# Patient Record
Sex: Male | Born: 1945 | Race: White | Hispanic: No | Marital: Married | Smoking: Former smoker
Health system: Southern US, Community
[De-identification: ages and names within clinical notes are randomized; demographics above are authoritative.]

## PROBLEM LIST (undated history)

## (undated) DIAGNOSIS — C801 Malignant (primary) neoplasm, unspecified: Secondary | ICD-10-CM

## (undated) DIAGNOSIS — F32A Depression, unspecified: Secondary | ICD-10-CM

## (undated) DIAGNOSIS — F329 Major depressive disorder, single episode, unspecified: Secondary | ICD-10-CM

## (undated) DIAGNOSIS — K219 Gastro-esophageal reflux disease without esophagitis: Secondary | ICD-10-CM

## (undated) DIAGNOSIS — E785 Hyperlipidemia, unspecified: Secondary | ICD-10-CM

## (undated) DIAGNOSIS — I714 Abdominal aortic aneurysm, without rupture, unspecified: Secondary | ICD-10-CM

## (undated) DIAGNOSIS — F419 Anxiety disorder, unspecified: Secondary | ICD-10-CM

## (undated) HISTORY — PX: SPINE SURGERY: SHX786

## (undated) HISTORY — DX: Abdominal aortic aneurysm, without rupture, unspecified: I71.40

## (undated) HISTORY — DX: Depression, unspecified: F32.A

## (undated) HISTORY — DX: Abdominal aortic aneurysm, without rupture: I71.4

## (undated) HISTORY — DX: Anxiety disorder, unspecified: F41.9

## (undated) HISTORY — PX: HERNIA REPAIR: SHX51

## (undated) HISTORY — PX: COLONOSCOPY: SHX174

## (undated) HISTORY — DX: Gastro-esophageal reflux disease without esophagitis: K21.9

## (undated) HISTORY — DX: Hyperlipidemia, unspecified: E78.5

---
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
## 1982-10-27 HISTORY — PX: HERNIA REPAIR: SHX51

## 1985-10-27 HISTORY — PX: SPINE SURGERY: SHX786

## 2013-12-08 ENCOUNTER — Encounter: Payer: Self-pay | Admitting: *Deleted

## 2013-12-26 ENCOUNTER — Ambulatory Visit: Payer: Self-pay | Admitting: General Practice

## 2013-12-28 ENCOUNTER — Encounter: Payer: Self-pay | Admitting: General Practice

## 2013-12-28 ENCOUNTER — Ambulatory Visit (INDEPENDENT_AMBULATORY_CARE_PROVIDER_SITE_OTHER): Payer: Medicare HMO | Admitting: General Practice

## 2013-12-28 ENCOUNTER — Encounter (INDEPENDENT_AMBULATORY_CARE_PROVIDER_SITE_OTHER): Payer: Self-pay

## 2013-12-28 VITALS — BP 129/82 | HR 73 | Temp 97.0°F | Ht 67.75 in | Wt 178.0 lb

## 2013-12-28 DIAGNOSIS — R21 Rash and other nonspecific skin eruption: Secondary | ICD-10-CM

## 2013-12-28 DIAGNOSIS — F411 Generalized anxiety disorder: Secondary | ICD-10-CM

## 2013-12-28 MED ORDER — CITALOPRAM HYDROBROMIDE 20 MG PO TABS: 20.0000 mg | ORAL_TABLET | Freq: Every day | ORAL | Status: DC

## 2013-12-28 NOTE — Progress Notes (Signed)
   Subjective:    Patient ID: Michael Horton, male    DOB: 07-27-1946, 68 y.o.   MRN: [id number]  HPI Patient presents today to establish care. He moved from [place], 1 year ago to [place], [place]. History of anxiety and taking citalopram 20mg  daily. Reports anxiey well controlled with this medication. Reports noticing a change in mole on right lateral forehead and would like to be seen by dermatology.     Review of Systems  Constitutional: Negative for fever and chills.  Respiratory: Negative for chest tightness and shortness of breath.   Cardiovascular: Negative for chest pain and palpitations.  Genitourinary: Negative for difficulty urinating.  Neurological: Negative for dizziness, weakness and headaches.       Objective:   Physical Exam  Constitutional: He is oriented to person, place, and time. He appears well-developed and well-nourished.  HENT:  Head: Normocephalic and atraumatic.  Right Ear: External ear normal.  Left Ear: External ear normal.  Mouth/Throat: Oropharynx is clear and moist.  Eyes: EOM are normal. Pupils are equal, round, and reactive to light.  Neck: Normal range of motion. Neck supple. No thyromegaly present.  Cardiovascular: Normal rate, regular rhythm and normal heart sounds.   Pulmonary/Chest: Effort normal and breath sounds normal. No respiratory distress. He exhibits no tenderness.  Abdominal: Soft. Bowel sounds are normal. He exhibits no distension. There is no tenderness.  Lymphadenopathy:    He has no cervical adenopathy.  Neurological: He is alert and oriented to person, place, and time.  Skin: Skin is warm and dry.  Mole noted to right forehead  Psychiatric: He has a normal mood and affect.          Assessment & Plan:  1. Skin eruption  - Ambulatory referral to Dermatology  2. GAD (generalized anxiety disorder)  - citalopram (CELEXA) 20 MG tablet; Take 1 tablet (20 mg total) by mouth daily.  Dispense: 90 tablet; Refill: 3 -Continue all  current medications Labs results to be faxed from previous provider F/u in 6 months and prn Discussed benefits of regular exercise and healthy eating Patient verbalized understanding Erby Pian, FNP-C

## 2013-12-28 NOTE — Patient Instructions (Signed)

## 2014-01-01 DIAGNOSIS — F411 Generalized anxiety disorder: Secondary | ICD-10-CM | POA: Insufficient documentation

## 2014-01-19 ENCOUNTER — Other Ambulatory Visit: Payer: Self-pay | Admitting: *Deleted

## 2014-01-19 DIAGNOSIS — F411 Generalized anxiety disorder: Secondary | ICD-10-CM

## 2014-01-19 MED ORDER — CITALOPRAM HYDROBROMIDE 20 MG PO TABS
20.0000 mg | ORAL_TABLET | Freq: Every day | ORAL | Status: DC
Start: 2014-01-19 — End: 2014-03-22

## 2014-03-22 ENCOUNTER — Other Ambulatory Visit: Payer: Self-pay | Admitting: General Practice

## 2014-03-23 NOTE — Telephone Encounter (Signed)
Last sen 12/28/13 Mae  Requesting a 90 day supply

## 2014-05-23 ENCOUNTER — Other Ambulatory Visit: Payer: Self-pay | Admitting: Nurse Practitioner

## 2014-05-24 NOTE — Telephone Encounter (Signed)
Last seen 12/28/13  Michael Horton

## 2014-07-21 ENCOUNTER — Other Ambulatory Visit: Payer: Self-pay | Admitting: Nurse Practitioner

## 2014-09-01 ENCOUNTER — Ambulatory Visit (INDEPENDENT_AMBULATORY_CARE_PROVIDER_SITE_OTHER): Payer: Medicare HMO | Admitting: *Deleted

## 2014-09-01 ENCOUNTER — Ambulatory Visit: Payer: Medicare HMO

## 2014-09-01 DIAGNOSIS — Z23 Encounter for immunization: Secondary | ICD-10-CM

## 2014-10-09 ENCOUNTER — Ambulatory Visit (INDEPENDENT_AMBULATORY_CARE_PROVIDER_SITE_OTHER): Payer: Medicare HMO | Admitting: Family Medicine

## 2014-10-09 ENCOUNTER — Encounter: Payer: Self-pay | Admitting: Family Medicine

## 2014-10-09 VITALS — BP 129/81 | HR 62 | Temp 97.2°F | Ht 67.75 in | Wt 176.2 lb

## 2014-10-09 DIAGNOSIS — F32A Depression, unspecified: Secondary | ICD-10-CM

## 2014-10-09 DIAGNOSIS — F329 Major depressive disorder, single episode, unspecified: Secondary | ICD-10-CM

## 2014-10-09 DIAGNOSIS — Z139 Encounter for screening, unspecified: Secondary | ICD-10-CM

## 2014-10-09 DIAGNOSIS — R5383 Other fatigue: Secondary | ICD-10-CM

## 2014-10-09 LAB — POCT CBC
Granulocyte percent: 66 %G (ref 37–80)
HCT, POC: 44.7 % (ref 43.5–53.7)
Hemoglobin: 14 g/dL — AB (ref 14.1–18.1)
Lymph, poc: 2.9 (ref 0.6–3.4)
MCH, POC: 26.5 pg — AB (ref 27–31.2)
MCHC: 31.4 g/dL — AB (ref 31.8–35.4)
MCV: 84.6 fL (ref 80–97)
MPV: 9.2 fL (ref 0–99.8)
POC Granulocyte: 6.5 (ref 2–6.9)
POC LYMPH PERCENT: 29.1 %L (ref 10–50)
Platelet Count, POC: 246 10*3/uL (ref 142–424)
RBC: 5.3 M/uL (ref 4.69–6.13)
RDW, POC: 16.6 %
WBC: 9.8 10*3/uL (ref 4.6–10.2)

## 2014-10-09 MED ORDER — CITALOPRAM HYDROBROMIDE 20 MG PO TABS: 20.0000 mg | ORAL_TABLET | Freq: Every day | ORAL | Status: DC

## 2014-10-09 NOTE — Progress Notes (Signed)
   Subjective:    Patient ID: Michael Horton, male    DOB: 1945-11-17, 68 y.o.   MRN: [id number]  HPI Patient is here for CPE.  Review of Systems  Constitutional: Negative for fever.  HENT: Negative for ear pain.   Eyes: Negative for discharge.  Respiratory: Negative for cough.   Cardiovascular: Negative for chest pain.  Gastrointestinal: Negative for abdominal distention.  Endocrine: Negative for polyuria.  Genitourinary: Negative for difficulty urinating.  Musculoskeletal: Negative for gait problem and neck pain.  Skin: Negative for color change and rash.  Neurological: Negative for speech difficulty and headaches.  Psychiatric/Behavioral: Negative for agitation.       Objective:    BP 129/81 mmHg  Pulse 62  Temp(Src) 97.2 F (36.2 C) (Oral)  Ht 5' 7.75" (1.721 m)  Wt 176 lb 3.2 oz (79.924 kg)  BMI 26.98 kg/m2 Physical Exam  Constitutional: He is oriented to person, place, and time. He appears well-developed and well-nourished.  HENT:  Head: Normocephalic and atraumatic.  Mouth/Throat: Oropharynx is clear and moist.  Eyes: Pupils are equal, round, and reactive to light.  Neck: Normal range of motion. Neck supple.  Cardiovascular: Normal rate and regular rhythm.   No murmur heard. Pulmonary/Chest: Effort normal and breath sounds normal.  Abdominal: Soft. Bowel sounds are normal. There is no tenderness.  Neurological: He is alert and oriented to person, place, and time.  Skin: Skin is warm and dry.  Psychiatric: He has a normal mood and affect.          Assessment & Plan:     ICD-9-CM ICD-10-CM   1. Other fatigue 780.79 R53.83 POCT CBC     CMP14+EGFR     Lipid panel     PSA, total and free     Thyroid Panel With TSH  2. Screening V82.9 Z13.9 POCT CBC     CMP14+EGFR     Lipid panel     PSA, total and free     Thyroid Panel With TSH  3. Depression 311 F32.9 citalopram (CELEXA) 20 MG tablet     No Follow-up on file.  Lysbeth Penner FNP

## 2014-10-10 ENCOUNTER — Telehealth: Payer: Self-pay | Admitting: *Deleted

## 2014-10-10 LAB — CMP14+EGFR
ALT: 17 IU/L (ref 0–44)
AST: 15 IU/L (ref 0–40)
Albumin/Globulin Ratio: 1.8 (ref 1.1–2.5)
Albumin: 4.4 g/dL (ref 3.6–4.8)
Alkaline Phosphatase: 105 IU/L (ref 39–117)
BUN/Creatinine Ratio: 13 (ref 10–22)
BUN: 13 mg/dL (ref 8–27)
CO2: 25 mmol/L (ref 18–29)
Calcium: 9.7 mg/dL (ref 8.6–10.2)
Chloride: 100 mmol/L (ref 97–108)
Creatinine, Ser: 0.97 mg/dL (ref 0.76–1.27)
GFR calc Af Amer: 93 mL/min/{1.73_m2} (ref >59)
GFR calc non Af Amer: 80 mL/min/{1.73_m2} (ref >59)
Globulin, Total: 2.5 g/dL (ref 1.5–4.5)
Glucose: 80 mg/dL (ref 65–99)
Potassium: 5.3 mmol/L — ABNORMAL HIGH (ref 3.5–5.2)
Sodium: 140 mmol/L (ref 134–144)
Total Bilirubin: 0.3 mg/dL (ref 0.0–1.2)
Total Protein: 6.9 g/dL (ref 6.0–8.5)

## 2014-10-10 LAB — PSA, TOTAL AND FREE
PSA, Free Pct: 19.4 %
PSA, Free: 0.35 ng/mL
PSA: 1.8 ng/mL (ref 0.0–4.0)

## 2014-10-10 LAB — LIPID PANEL
Chol/HDL Ratio: 6.3 ratio units — ABNORMAL HIGH (ref 0.0–5.0)
Cholesterol, Total: 295 mg/dL — ABNORMAL HIGH (ref 100–199)
HDL: 47 mg/dL (ref >39)
LDL Calculated: 187 mg/dL — ABNORMAL HIGH (ref 0–99)
Triglycerides: 306 mg/dL — ABNORMAL HIGH (ref 0–149)
VLDL Cholesterol Cal: 61 mg/dL — ABNORMAL HIGH (ref 5–40)

## 2014-10-10 LAB — THYROID PANEL WITH TSH
Free Thyroxine Index: 1.8 (ref 1.2–4.9)
T3 Uptake Ratio: 26 % (ref 24–39)
T4, Total: 7.1 ug/dL (ref 4.5–12.0)
TSH: 1.31 u[IU]/mL (ref 0.450–4.500)

## 2014-10-10 NOTE — Telephone Encounter (Signed)
He will continue with diet and exercise. Does not want medicine.

## 2014-10-10 NOTE — Telephone Encounter (Signed)
-----   Message from Lysbeth Penner, FNP sent at 10/10/2014  8:26 AM EST ----- Lipids elevated and recommend going on statin like crestor

## 2015-01-30 ENCOUNTER — Telehealth: Payer: Self-pay | Admitting: Family Medicine

## 2015-04-17 DIAGNOSIS — L570 Actinic keratosis: Secondary | ICD-10-CM | POA: Diagnosis not present

## 2015-07-18 ENCOUNTER — Telehealth: Payer: Self-pay | Admitting: Family Medicine

## 2015-10-28 HISTORY — PX: ANKLE FRACTURE SURGERY: SHX122

## 2015-10-28 HISTORY — PX: TIBIA FRACTURE SURGERY: SHX806

## 2015-12-13 ENCOUNTER — Encounter: Payer: Self-pay | Admitting: Family Medicine

## 2015-12-13 ENCOUNTER — Ambulatory Visit (INDEPENDENT_AMBULATORY_CARE_PROVIDER_SITE_OTHER): Payer: Commercial Managed Care - HMO | Admitting: Family Medicine

## 2015-12-13 DIAGNOSIS — F32A Depression, unspecified: Secondary | ICD-10-CM

## 2015-12-13 DIAGNOSIS — R351 Nocturia: Secondary | ICD-10-CM

## 2015-12-13 DIAGNOSIS — K219 Gastro-esophageal reflux disease without esophagitis: Secondary | ICD-10-CM | POA: Diagnosis not present

## 2015-12-13 DIAGNOSIS — E663 Overweight: Secondary | ICD-10-CM

## 2015-12-13 DIAGNOSIS — F329 Major depressive disorder, single episode, unspecified: Secondary | ICD-10-CM | POA: Diagnosis not present

## 2015-12-13 DIAGNOSIS — Z0000 Encounter for general adult medical examination without abnormal findings: Secondary | ICD-10-CM | POA: Diagnosis not present

## 2015-12-13 MED ORDER — CITALOPRAM HYDROBROMIDE 20 MG PO TABS: 20.0000 mg | ORAL_TABLET | Freq: Every day | ORAL | Status: DC

## 2015-12-13 NOTE — Progress Notes (Signed)
BP 128/84 mmHg  Pulse 76  Temp(Src) 96.8 F (36 C) (Oral)  Ht 5' 7.75" (1.721 m)  Wt 178 lb 3.2 oz (80.831 kg)  BMI 27.29 kg/m2   Subjective:    Patient ID: Michael Horton, male    DOB: 1946/01/15, 70 y.o.   MRN: [id number]  HPI: Michael Horton is a 69 y.o. male presenting on 12/13/2015 for Annual Exam   HPI Depression Patient has been on Celexa for a few years for his anxiety and depression and feels like it is doing very well for him. He denies any side effects from the medication. He denies any suicidal ideations or thoughts of hurting himself. He is very happy and content with his life and the medication. He has been out of the medication for the past couple days and has noticed the difference.  Nocturia Patient gets up 2-3 times at night and has had his prostate levels monitored and is due for recheck. He's never had an elevated one before previously.  GERD Patient has known acid reflux and is buying over-the-counter Prilosec and it is working very well for him to control it. He denies any symptoms from it while he is on the medication. He says he does notice when he comes off of it.  Relevant past medical, surgical, family and social history reviewed and updated as indicated. Interim medical history since our last visit reviewed. Allergies and medications reviewed and updated.  Review of Systems  Constitutional: Negative for fever and chills.  HENT: Negative for congestion, ear discharge and ear pain.   Eyes: Negative for discharge and visual disturbance.  Respiratory: Negative for shortness of breath and wheezing.   Cardiovascular: Negative for chest pain and leg swelling.  Gastrointestinal: Negative for abdominal pain, diarrhea and constipation.  Genitourinary: Negative for difficulty urinating.  Musculoskeletal: Negative for back pain and gait problem.  Skin: Negative for rash.  Neurological: Negative for dizziness, syncope, light-headedness and headaches.    Psychiatric/Behavioral: Positive for dysphoric mood. Negative for suicidal ideas, sleep disturbance, self-injury and decreased concentration. The patient is nervous/anxious.   All other systems reviewed and are negative.  Per HPI unless specifically indicated above    Medication List       This list is accurate as of: 12/13/15  9:54 AM.  Always use your most recent med list.               citalopram 20 MG tablet  Commonly known as:  CELEXA  Take 1 tablet (20 mg total) by mouth daily.     omeprazole 20 MG capsule  Commonly known as:  PRILOSEC  Take 20 mg by mouth daily.          Objective:    BP 128/84 mmHg  Pulse 76  Temp(Src) 96.8 F (36 C) (Oral)  Ht 5' 7.75" (1.721 m)  Wt 178 lb 3.2 oz (80.831 kg)  BMI 27.29 kg/m2  Wt Readings from Last 3 Encounters:  12/13/15 178 lb 3.2 oz (80.831 kg)  10/09/14 176 lb 3.2 oz (79.924 kg)  12/28/13 178 lb (80.74 kg)    Physical Exam  Constitutional: He is oriented to person, place, and time. He appears well-developed and well-nourished. No distress.  Eyes: Conjunctivae and EOM are normal. Pupils are equal, round, and reactive to light. Right eye exhibits no discharge. No scleral icterus.  Neck: Neck supple. No thyromegaly present.  Cardiovascular: Normal rate, regular rhythm, normal heart sounds and intact distal pulses.   No murmur  heard. Pulmonary/Chest: Effort normal and breath sounds normal. No respiratory distress. He has no wheezes.  Abdominal: He exhibits no distension. There is no tenderness. There is no rebound.  Musculoskeletal: Normal range of motion. He exhibits no edema.  Lymphadenopathy:    He has no cervical adenopathy.  Neurological: He is alert and oriented to person, place, and time. Coordination normal.  Skin: Skin is warm and dry. No rash noted. He is not diaphoretic.  Psychiatric: His behavior is normal. Judgment and thought content normal. His mood appears anxious. His affect is not blunt and not labile.  He does not exhibit a depressed mood. He expresses no suicidal ideation. He expresses no suicidal plans.  Nursing note and vitals reviewed.      Assessment & Plan:   Problem List Items Addressed This Visit    None    Visit Diagnoses    Depression        Relevant Medications    citalopram (CELEXA) 20 MG tablet    Overweight (BMI 25.0-29.9)        Relevant Orders    CMP14+EGFR    Lipid panel    Nocturia        Relevant Orders    PSA, total and free    Gastroesophageal reflux disease without esophagitis        Relevant Medications    omeprazole (PRILOSEC) 20 MG capsule        Follow up plan: Return in about 1 year (around 12/12/2016), or if symptoms worsen or fail to improve.  Counseling provided for all of the vaccine components Orders Placed This Encounter  Procedures  . CMP14+EGFR  . Lipid panel  . PSA, total and free    Caryl Pina, MD [facility] 12/13/2015, 9:54 AM

## 2015-12-14 LAB — LIPID PANEL
Chol/HDL Ratio: 6.8 ratio units — ABNORMAL HIGH (ref 0.0–5.0)
Cholesterol, Total: 253 mg/dL — ABNORMAL HIGH (ref 100–199)
HDL: 37 mg/dL — ABNORMAL LOW (ref >39)
Triglycerides: 441 mg/dL — ABNORMAL HIGH (ref 0–149)

## 2015-12-14 LAB — CMP14+EGFR
A/G RATIO: 1.5 (ref 1.1–2.5)
ALBUMIN: 4.2 g/dL (ref 3.6–4.8)
ALT: 16 IU/L (ref 0–44)
AST: 18 IU/L (ref 0–40)
Alkaline Phosphatase: 106 IU/L (ref 39–117)
BUN / CREAT RATIO: 17 (ref 10–22)
BUN: 15 mg/dL (ref 8–27)
CALCIUM: 9.7 mg/dL (ref 8.6–10.2)
CHLORIDE: 100 mmol/L (ref 96–106)
CO2: 24 mmol/L (ref 18–29)
Creatinine, Ser: 0.9 mg/dL (ref 0.76–1.27)
GFR, EST AFRICAN AMERICAN: 101 mL/min/{1.73_m2} (ref >59)
GFR, EST NON AFRICAN AMERICAN: 87 mL/min/{1.73_m2} (ref >59)
GLOBULIN, TOTAL: 2.8 g/dL (ref 1.5–4.5)
Glucose: 79 mg/dL (ref 65–99)
POTASSIUM: 5.1 mmol/L (ref 3.5–5.2)
Sodium: 139 mmol/L (ref 134–144)
TOTAL PROTEIN: 7 g/dL (ref 6.0–8.5)

## 2015-12-14 LAB — PSA, TOTAL AND FREE
PROSTATE SPECIFIC AG, SERUM: 1.2 ng/mL (ref 0.0–4.0)
PSA FREE: 0.23 ng/mL
PSA, Free Pct: 19.2 %

## 2016-01-08 DIAGNOSIS — H5210 Myopia, unspecified eye: Secondary | ICD-10-CM | POA: Diagnosis not present

## 2016-01-08 DIAGNOSIS — H2510 Age-related nuclear cataract, unspecified eye: Secondary | ICD-10-CM | POA: Diagnosis not present

## 2016-01-08 DIAGNOSIS — H524 Presbyopia: Secondary | ICD-10-CM | POA: Diagnosis not present

## 2016-03-12 ENCOUNTER — Encounter: Payer: Self-pay | Admitting: Physician Assistant

## 2016-03-12 ENCOUNTER — Encounter (INDEPENDENT_AMBULATORY_CARE_PROVIDER_SITE_OTHER): Payer: Self-pay

## 2016-03-12 ENCOUNTER — Ambulatory Visit (INDEPENDENT_AMBULATORY_CARE_PROVIDER_SITE_OTHER): Payer: Commercial Managed Care - HMO | Admitting: Physician Assistant

## 2016-03-12 VITALS — BP 115/76 | HR 77 | Temp 98.4°F | Ht 67.75 in | Wt 175.0 lb

## 2016-03-12 DIAGNOSIS — R300 Dysuria: Secondary | ICD-10-CM

## 2016-03-12 LAB — URINALYSIS, COMPLETE
BILIRUBIN UA: NEGATIVE
Glucose, UA: NEGATIVE
NITRITE UA: NEGATIVE
Specific Gravity, UA: 1.025 (ref 1.005–1.030)
UUROB: 1 mg/dL (ref 0.2–1.0)
pH, UA: 6 (ref 5.0–7.5)

## 2016-03-12 LAB — MICROSCOPIC EXAMINATION
Bacteria, UA: NONE SEEN
EPITHELIAL CELLS (NON RENAL): NONE SEEN /HPF (ref 0–10)
WBC, UA: >30 /hpf — AB (ref 0–?)

## 2016-03-12 MED ORDER — SULFAMETHOXAZOLE-TRIMETHOPRIM 800-160 MG PO TABS: 1.0000 | ORAL_TABLET | Freq: Two times a day (BID) | ORAL | Status: DC

## 2016-03-12 NOTE — Patient Instructions (Signed)

## 2016-03-12 NOTE — Progress Notes (Signed)
Subjective:     Patient ID: Michael Horton, male   DOB: 1946-07-15, 70 y.o.   MRN: [id number]  HPI Pt with fatigue, joint pain, and loss of appetite  Review of Systems  Constitutional: Positive for fever, chills, activity change, appetite change and fatigue.  HENT: Negative.   Respiratory: Negative.   Cardiovascular: Negative.   Gastrointestinal: Negative.   Genitourinary: Positive for dysuria, frequency, decreased urine volume and difficulty urinating. Negative for urgency, hematuria and enuresis.       Objective:   Physical Exam  Constitutional: He appears well-developed and well-nourished.  HENT:  Mouth/Throat: Oropharynx is clear and moist. No oropharyngeal exudate.  Neck: Neck supple.  Cardiovascular: Normal rate, regular rhythm and normal heart sounds.   No murmur heard. Pulmonary/Chest: Effort normal and breath sounds normal. He has no wheezes.  Abdominal: Soft. Bowel sounds are normal. He exhibits no distension and no mass. There is no tenderness. There is no rebound and no guarding.  No CVAT  Lymphadenopathy:    He has no cervical adenopathy.  Nursing note and vitals reviewed.      Assessment:     1. 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

## 2016-03-14 ENCOUNTER — Telehealth: Payer: Self-pay | Admitting: Family Medicine

## 2016-03-14 MED ORDER — CIPROFLOXACIN HCL 500 MG PO TABS: 500.0000 mg | ORAL_TABLET | Freq: Two times a day (BID) | ORAL | Status: DC

## 2016-03-14 NOTE — Telephone Encounter (Signed)
Fever will improve with time Change to Cipro '500mg'$  bid x 2 weeks

## 2016-03-14 NOTE — Telephone Encounter (Signed)
Wife aware of change of abx d/t nausea w/ bactrim

## 2016-03-14 NOTE — Telephone Encounter (Signed)
Pt is eating before taking medication Med is causing severe nausea Please advise

## 2016-03-14 NOTE — Telephone Encounter (Signed)
Please have pt try to take medicine with a meal to see if this helps

## 2016-03-26 ENCOUNTER — Ambulatory Visit (INDEPENDENT_AMBULATORY_CARE_PROVIDER_SITE_OTHER): Payer: Commercial Managed Care - HMO | Admitting: Physician Assistant

## 2016-03-26 ENCOUNTER — Encounter: Payer: Self-pay | Admitting: Physician Assistant

## 2016-03-26 VITALS — BP 115/73 | HR 82 | Temp 98.3°F | Ht 67.75 in | Wt 174.4 lb

## 2016-03-26 DIAGNOSIS — B3749 Other urogenital candidiasis: Secondary | ICD-10-CM | POA: Diagnosis not present

## 2016-03-26 DIAGNOSIS — N410 Acute prostatitis: Secondary | ICD-10-CM | POA: Diagnosis not present

## 2016-03-26 LAB — MICROSCOPIC EXAMINATION
Bacteria, UA: NONE SEEN
WBC, UA: NONE SEEN /hpf (ref 0–?)

## 2016-03-26 LAB — URINALYSIS, COMPLETE
Bilirubin, UA: NEGATIVE
Glucose, UA: NEGATIVE
Leukocytes, UA: NEGATIVE
Nitrite, UA: NEGATIVE
Protein, UA: NEGATIVE
RBC, UA: NEGATIVE
Specific Gravity, UA: >1.03 — ABNORMAL HIGH (ref 1.005–1.030)
Urobilinogen, Ur: 0.2 mg/dL (ref 0.2–1.0)
pH, UA: 6.5 (ref 5.0–7.5)

## 2016-03-26 NOTE — Patient Instructions (Signed)

## 2016-03-26 NOTE — Progress Notes (Signed)
Subjective:     Patient ID: Michael Horton, male   DOB: 02/06/1946, 70 y.o.   MRN: [id number]  HPI Pt here for f/u of prostatitis He was unable to tolerate the Septra DS and could not do Doxycycline due to sun exposure He has tolerated the Cipro well Pt has none of his prev sx and states doing well  Review of Systems  Constitutional: Negative.   Gastrointestinal: Negative.   Genitourinary: Negative for dysuria, urgency, frequency, hematuria, flank pain, decreased urine volume and difficulty urinating.       Objective:   Physical Exam  Abdominal: Soft. Bowel sounds are normal. He exhibits no distension and no mass. There is no tenderness. There is no rebound and no guarding.  No CVAT  Nursing note and vitals reviewed. Urine- much improved, still with some sl hematuria on micro     Assessment:     1. Acute prostatitis        Plan:     Finish course of Cipro Hold further ATB at this time Since he is a long term smoker and has trace hematuria would like a f/u in 1 month for repeat urine

## 2016-04-22 ENCOUNTER — Ambulatory Visit (INDEPENDENT_AMBULATORY_CARE_PROVIDER_SITE_OTHER): Payer: Commercial Managed Care - HMO | Admitting: Family Medicine

## 2016-04-22 ENCOUNTER — Encounter: Payer: Self-pay | Admitting: Family Medicine

## 2016-04-22 VITALS — BP 121/78 | HR 72 | Temp 97.4°F | Ht 67.75 in | Wt 170.4 lb

## 2016-04-22 DIAGNOSIS — R350 Frequency of micturition: Secondary | ICD-10-CM | POA: Diagnosis not present

## 2016-04-22 DIAGNOSIS — R319 Hematuria, unspecified: Secondary | ICD-10-CM

## 2016-04-22 LAB — MICROSCOPIC EXAMINATION
BACTERIA UA: NONE SEEN
RBC MICROSCOPIC, UA: NONE SEEN /HPF (ref 0–?)

## 2016-04-22 LAB — URINALYSIS, COMPLETE
Bilirubin, UA: NEGATIVE
Glucose, UA: NEGATIVE
Nitrite, UA: NEGATIVE
Protein, UA: NEGATIVE
RBC, UA: NEGATIVE
Specific Gravity, UA: 1.025 (ref 1.005–1.030)
Urobilinogen, Ur: 0.2 mg/dL (ref 0.2–1.0)
pH, UA: 6.5 (ref 5.0–7.5)

## 2016-04-22 NOTE — Progress Notes (Signed)
BP 121/78 mmHg  Pulse 72  Temp(Src) 97.4 F (36.3 C) (Oral)  Ht 5' 7.75" (1.721 m)  Wt 170 lb 6.4 oz (77.293 kg)  BMI 26.10 kg/m2  SpO2 96%   Subjective:    Patient ID: Michael Horton, male    DOB: 10/07/1946, 70 y.o.   MRN: [id number]  HPI: Michael Horton is a 70 y.o. male presenting on 04/22/2016 for Urinary Tract Infection   HPI Recheck on dysuria and prostatitis Patient comes in today for a recheck after finishing course of antibiotics on prostatitis and dysuria. He was treated with 2 weeks of antibiotics and symptoms have completely resolved. He denies any urinary issues or abdominal complaints. He denies any fevers or chills or flank pain. He denies any hematuria. He was also coming in for recheck today because he had hematuria on his last 2 urines and wanted to recheck that today. He has never had gross hematuria  Relevant past medical, surgical, family and social history reviewed and updated as indicated. Interim medical history since our last visit reviewed. Allergies and medications reviewed and updated.  Review of Systems  Constitutional: Negative for fever and chills.  HENT: Negative for ear discharge and ear pain.   Eyes: Negative for discharge and visual disturbance.  Respiratory: Negative for shortness of breath and wheezing.   Cardiovascular: Negative for chest pain and leg swelling.  Gastrointestinal: Negative for abdominal pain, diarrhea and constipation.  Genitourinary: Negative for difficulty urinating.  Musculoskeletal: Negative for back pain and gait problem.  Skin: Negative for rash.  Neurological: Negative for syncope, light-headedness and headaches.  All other systems reviewed and are negative.   Per HPI unless specifically indicated above     Medication List       This list is accurate as of: 04/22/16  1:38 PM.  Always use your most recent med list.               citalopram 20 MG tablet  Commonly known as:  CELEXA  Take 1 tablet (20 mg total)  by mouth daily.     omeprazole 20 MG capsule  Commonly known as:  PRILOSEC  Take 20 mg by mouth daily.           Objective:    BP 121/78 mmHg  Pulse 72  Temp(Src) 97.4 F (36.3 C) (Oral)  Ht 5' 7.75" (1.721 m)  Wt 170 lb 6.4 oz (77.293 kg)  BMI 26.10 kg/m2  SpO2 96%  Wt Readings from Last 3 Encounters:  04/22/16 170 lb 6.4 oz (77.293 kg)  03/26/16 174 lb 6.4 oz (79.107 kg)  03/12/16 175 lb (79.379 kg)    Physical Exam  Constitutional: He is oriented to person, place, and time. He appears well-developed and well-nourished. No distress.  Eyes: Conjunctivae and EOM are normal. Pupils are equal, round, and reactive to light. Right eye exhibits no discharge. No scleral icterus.  Neck: Neck supple. No thyromegaly present.  Cardiovascular: Normal rate, regular rhythm, normal heart sounds and intact distal pulses.   No murmur heard. Pulmonary/Chest: Effort normal and breath sounds normal. No respiratory distress. He has no wheezes.  Musculoskeletal: Normal range of motion. He exhibits no edema.  Lymphadenopathy:    He has no cervical adenopathy.  Neurological: He is alert and oriented to person, place, and time. Coordination normal.  Skin: Skin is warm and dry. No rash noted. He is not diaphoretic.  Psychiatric: He has a normal mood and affect. His behavior is normal.  Nursing note and vitals reviewed.   Urinalysis shows trace leukocytes and trace ketones but no blood.    Assessment & Plan:       Problem List Items Addressed This Visit    None    Visit Diagnoses    Urinary frequency    -  Primary    UA shows trace leukocytes and no blood and trace ketones. Patient is asymptomatic.    Relevant Orders    Urinalysis, Complete    Hematuria        Hematuria is resolved        Follow up plan: Return if symptoms worsen or fail to improve.  Counseling provided for all of the vaccine components Orders Placed This Encounter  Procedures  . Urinalysis, Complete     Caryl Pina, MD [facility] 04/22/2016, 1:38 PM

## 2016-12-03 ENCOUNTER — Other Ambulatory Visit: Payer: Self-pay | Admitting: Family Medicine

## 2016-12-03 DIAGNOSIS — F32A Depression, unspecified: Secondary | ICD-10-CM

## 2016-12-03 DIAGNOSIS — F329 Major depressive disorder, single episode, unspecified: Secondary | ICD-10-CM

## 2016-12-04 NOTE — Telephone Encounter (Signed)
Patient needs an appointment, will give one month but needs to be seen before next refill.

## 2016-12-04 NOTE — Telephone Encounter (Signed)
Pt aware NTBS before next refill

## 2017-01-02 ENCOUNTER — Ambulatory Visit (INDEPENDENT_AMBULATORY_CARE_PROVIDER_SITE_OTHER): Payer: Medicare HMO

## 2017-01-02 ENCOUNTER — Encounter: Payer: Self-pay | Admitting: Family

## 2017-01-02 ENCOUNTER — Ambulatory Visit (INDEPENDENT_AMBULATORY_CARE_PROVIDER_SITE_OTHER): Payer: Medicare HMO | Admitting: Family

## 2017-01-02 VITALS — BP 106/75 | HR 81 | Temp 97.0°F | Ht 67.75 in | Wt 174.4 lb

## 2017-01-02 DIAGNOSIS — Z0000 Encounter for general adult medical examination without abnormal findings: Secondary | ICD-10-CM | POA: Diagnosis not present

## 2017-01-02 DIAGNOSIS — F331 Major depressive disorder, recurrent, moderate: Secondary | ICD-10-CM | POA: Diagnosis not present

## 2017-01-02 DIAGNOSIS — Z23 Encounter for immunization: Secondary | ICD-10-CM

## 2017-01-02 DIAGNOSIS — F17200 Nicotine dependence, unspecified, uncomplicated: Secondary | ICD-10-CM | POA: Diagnosis not present

## 2017-01-02 DIAGNOSIS — K219 Gastro-esophageal reflux disease without esophagitis: Secondary | ICD-10-CM | POA: Insufficient documentation

## 2017-01-02 DIAGNOSIS — F411 Generalized anxiety disorder: Secondary | ICD-10-CM | POA: Diagnosis not present

## 2017-01-02 DIAGNOSIS — F17210 Nicotine dependence, cigarettes, uncomplicated: Secondary | ICD-10-CM | POA: Insufficient documentation

## 2017-01-02 MED ORDER — VARENICLINE TARTRATE 1 MG PO TABS: 1.0000 mg | ORAL_TABLET | Freq: Two times a day (BID) | ORAL | 1 refills | Status: DC

## 2017-01-02 MED ORDER — CITALOPRAM HYDROBROMIDE 20 MG PO TABS: 20.0000 mg | ORAL_TABLET | Freq: Every day | ORAL | 3 refills | Status: DC

## 2017-01-02 MED ORDER — VARENICLINE TARTRATE 0.5 MG X 11 & 1 MG X 42 PO MISC: ORAL | 0 refills | Status: DC

## 2017-01-02 MED ORDER — ASPIRIN EC 81 MG PO TBEC: 81.0000 mg | DELAYED_RELEASE_TABLET | Freq: Every day | ORAL | 1 refills | Status: DC

## 2017-01-02 NOTE — Addendum Note (Signed)
Addended by: Shelbie Ammons on: 01/02/2017 04:14 PM   Modules accepted: Orders

## 2017-01-02 NOTE — Progress Notes (Signed)
Subjective:    Patient ID: Michael Horton, male    DOB: Jan 28, 1946, 71 y.o.   MRN: [id number]  PT presents to the office for CPE.  PT would like to talk about smoking cessation.  Anxiety  Presents for follow-up visit. Symptoms include excessive worry and irritability. Patient reports no depressed mood or nervous/anxious behavior. Symptoms occur rarely. The quality of sleep is fair.    Gastroesophageal Reflux  He complains of belching. He reports no heartburn. This is a chronic problem. The current episode started more than 1 year ago. The problem occurs occasionally. The problem has been resolved. The symptoms are aggravated by smoking and lying down. Risk factors include obesity. He has tried a PPI for the symptoms. The treatment provided moderate relief.      Review of Systems  Constitutional: Positive for irritability.  Gastrointestinal: Negative for heartburn.  Psychiatric/Behavioral: The patient is not nervous/anxious.   All other systems reviewed and are negative.      Objective:   Physical Exam  Constitutional: He is oriented to person, place, and time. He appears well-developed and well-nourished. No distress.  HENT:  Head: Normocephalic.  Right Ear: External ear normal.  Left Ear: External ear normal.  Mouth/Throat: Oropharynx is clear and moist.  Eyes: Pupils are equal, round, and reactive to light. Right eye exhibits no discharge. Left eye exhibits no discharge.  Neck: Normal range of motion. Neck supple. No thyromegaly present.  Cardiovascular: Normal rate, regular rhythm, normal heart sounds and intact distal pulses.   No murmur heard. Pulmonary/Chest: Effort normal and breath sounds normal. No respiratory distress. He has no wheezes.  Abdominal: Soft. Bowel sounds are normal. He exhibits no distension. There is no tenderness.  Musculoskeletal: Normal range of motion. He exhibits no edema or tenderness.  Neurological: He is alert and oriented to person, place, and  time. He has normal reflexes. No cranial nerve deficit.  Skin: Skin is warm and dry. No rash noted. No erythema.  Psychiatric: He has a normal mood and affect. His behavior is normal. Judgment and thought content normal.  Vitals reviewed.     BP 106/75   Pulse 81   Temp 97 F (36.1 C) (Oral)   Ht 5' 7.75" (1.721 m)   Wt 174 lb 6.4 oz (79.1 kg)   BMI 26.71 kg/m      Assessment & Plan:  1. Gastroesophageal reflux disease, esophagitis presence not specified - CMP14+EGFR  2. Annual physical exam - CMP14+EGFR - Lipid panel - PSA, total and free - Thyroid Panel With TSH - VITAMIN D 25 Hydroxy (Vit-D Deficiency, Fractures) - Hepatitis C antibody - EKG 12-Lead - DG Chest 2 View; Future -CBC  3. Generalized anxiety disorder - CMP14+EGFR - citalopram (CELEXA) 20 MG tablet; Take 1 tablet (20 mg total) by mouth daily.  Dispense: 90 tablet; Refill: 3  4. Moderate episode of recurrent major depressive disorder (HCC) - CMP14+EGFR - citalopram (CELEXA) 20 MG tablet; Take 1 tablet (20 mg total) by mouth daily.  Dispense: 90 tablet; Refill: 3  5. Current smoker -Pt started on chantix today - DG Chest 2 View; Future - varenicline (CHANTIX CONTINUING MONTH PAK) 1 MG tablet; Take 1 tablet (1 mg total) by mouth 2 (two) times daily.  Dispense: 60 tablet; Refill: 1 - varenicline (CHANTIX STARTING MONTH PAK) 0.5 MG X 11 & 1 MG X 42 tablet; Take one 0.5 mg tablet by mouth once daily for 3 days, then increase to one 0.5 mg tablet twice  daily for 4 days, then increase to one 1 mg tablet twice daily.  Dispense: 53 tablet; Refill: 0   Continue all meds Labs pending Health Maintenance reviewed-Pneumonia vaccine given today Diet and exercise encouraged RTO 6 months   Evelina Dun, FNP

## 2017-01-02 NOTE — Patient Instructions (Signed)
Steps to Quit Smoking Smoking tobacco can be bad for your health. It can also affect almost every organ in your body. Smoking puts you and people around you at risk for many serious long-lasting (chronic) diseases. Quitting smoking is hard, but it is one of the best things that you can do for your health. It is never too late to quit. What are the benefits of quitting smoking? When you quit smoking, you lower your risk for getting serious diseases and conditions. They can include:  Lung cancer or lung disease.  Heart disease.  Stroke.  Heart attack.  Not being able to have children (infertility).  Weak bones (osteoporosis) and broken bones (fractures). If you have coughing, wheezing, and shortness of breath, those symptoms may get better when you quit. You may also get sick less often. If you are pregnant, quitting smoking can help to lower your chances of having a baby of low birth weight. What can I do to help me quit smoking? Talk with your doctor about what can help you quit smoking. Some things you can do (strategies) include:  Quitting smoking totally, instead of slowly cutting back how much you smoke over a period of time.  Going to in-person counseling. You are more likely to quit if you go to many counseling sessions.  Using resources and support systems, such as:  Online chats with a counselor.  Phone quitlines.  Printed self-help materials.  Support groups or group counseling.  Text messaging programs.  Mobile phone apps or applications.  Taking medicines. Some of these medicines may have nicotine in them. If you are pregnant or breastfeeding, do not take any medicines to quit smoking unless your doctor says it is okay. Talk with your doctor about counseling or other things that can help you. Talk with your doctor about using more than one strategy at the same time, such as taking medicines while you are also going to in-person counseling. This can help make quitting  easier. What things can I do to make it easier to quit? Quitting smoking might feel very hard at first, but there is a lot that you can do to make it easier. Take these steps:  Talk to your family and friends. Ask them to support and encourage you.  Call phone quitlines, reach out to support groups, or work with a counselor.  Ask people who smoke to not smoke around you.  Avoid places that make you want (trigger) to smoke, such as:  Bars.  Parties.  Smoke-break areas at work.  Spend time with people who do not smoke.  Lower the stress in your life. Stress can make you want to smoke. Try these things to help your stress:  Getting regular exercise.  Deep-breathing exercises.  Yoga.  Meditating.  Doing a body scan. To do this, close your eyes, focus on one area of your body at a time from head to toe, and notice which parts of your body are tense. Try to relax the muscles in those areas.  Download or buy apps on your mobile phone or tablet that can help you stick to your quit plan. There are many free apps, such as QuitGuide from the CDC (Centers for Disease Control and Prevention). You can find more support from smokefree.gov and other websites. This information is not intended to replace advice given to you by your health care provider. Make sure you discuss any questions you have with your health care provider. Document Released: 08/09/2009 Document Revised: 06/10/2016 Document   Reviewed: 02/27/2015 Elsevier Interactive Patient Education  2017 [employer]  

## 2017-01-03 LAB — CMP14+EGFR
ALK PHOS: 122 IU/L — AB (ref 39–117)
ALT: 12 IU/L (ref 0–44)
AST: 13 IU/L (ref 0–40)
Albumin/Globulin Ratio: 1.6 (ref 1.2–2.2)
Albumin: 4.5 g/dL (ref 3.6–4.8)
BUN/Creatinine Ratio: 18 (ref 10–24)
BUN: 15 mg/dL (ref 8–27)
CHLORIDE: 95 mmol/L — AB (ref 96–106)
CO2: 22 mmol/L (ref 18–29)
Calcium: 9.7 mg/dL (ref 8.6–10.2)
Creatinine, Ser: 0.85 mg/dL (ref 0.76–1.27)
GFR calc Af Amer: 103 mL/min/{1.73_m2} (ref >59)
GFR calc non Af Amer: 89 mL/min/{1.73_m2} (ref >59)
Globulin, Total: 2.8 g/dL (ref 1.5–4.5)
Glucose: 96 mg/dL (ref 65–99)
Potassium: 5.3 mmol/L — ABNORMAL HIGH (ref 3.5–5.2)
Sodium: 141 mmol/L (ref 134–144)
Total Protein: 7.3 g/dL (ref 6.0–8.5)

## 2017-01-03 LAB — PSA, TOTAL AND FREE
PSA FREE: 0.71 ng/mL
PSA, Free Pct: 33.8 %
Prostate Specific Ag, Serum: 2.1 ng/mL (ref 0.0–4.0)

## 2017-01-03 LAB — LIPID PANEL
CHOLESTEROL TOTAL: 289 mg/dL — AB (ref 100–199)
Chol/HDL Ratio: 7.4 ratio units — ABNORMAL HIGH (ref 0.0–5.0)
HDL: 39 mg/dL — AB (ref >39)
Triglycerides: 687 mg/dL (ref 0–149)

## 2017-01-03 LAB — CBC WITH DIFFERENTIAL/PLATELET
Basophils Absolute: 0.1 10*3/uL (ref 0.0–0.2)
Basos: 0 %
EOS (ABSOLUTE): 0.2 10*3/uL (ref 0.0–0.4)
Eos: 1 %
HEMOGLOBIN: 13.3 g/dL (ref 13.0–17.7)
Hematocrit: 40.9 % (ref 37.5–51.0)
IMMATURE GRANS (ABS): 0 10*3/uL (ref 0.0–0.1)
Immature Granulocytes: 0 %
Lymphocytes Absolute: 2.8 10*3/uL (ref 0.7–3.1)
Lymphs: 20 %
MCH: 26.5 pg — AB (ref 26.6–33.0)
MCHC: 32.5 g/dL (ref 31.5–35.7)
MCV: 82 fL (ref 79–97)
MONOCYTES: 6 %
Monocytes Absolute: 0.9 10*3/uL (ref 0.1–0.9)
NEUTROS ABS: 10.1 10*3/uL — AB (ref 1.4–7.0)
Neutrophils: 73 %
Platelets: 281 10*3/uL (ref 150–379)
RBC: 5.02 x10E6/uL (ref 4.14–5.80)
RDW: 16.3 % — ABNORMAL HIGH (ref 12.3–15.4)
WBC: 14 10*3/uL — ABNORMAL HIGH (ref 3.4–10.8)

## 2017-01-03 LAB — THYROID PANEL WITH TSH
Free Thyroxine Index: 1.4 (ref 1.2–4.9)
T3 UPTAKE RATIO: 22 % — AB (ref 24–39)
T4 TOTAL: 6.2 ug/dL (ref 4.5–12.0)
TSH: 1.93 u[IU]/mL (ref 0.450–4.500)

## 2017-01-03 LAB — HEPATITIS C ANTIBODY

## 2017-01-03 LAB — VITAMIN D 25 HYDROXY (VIT D DEFICIENCY, FRACTURES): Vit D, 25-Hydroxy: 20.6 ng/mL — ABNORMAL LOW (ref 30.0–100.0)

## 2017-01-06 ENCOUNTER — Other Ambulatory Visit: Payer: Self-pay | Admitting: Family

## 2017-01-06 DIAGNOSIS — E559 Vitamin D deficiency, unspecified: Secondary | ICD-10-CM

## 2017-01-06 DIAGNOSIS — E781 Pure hyperglyceridemia: Secondary | ICD-10-CM | POA: Insufficient documentation

## 2017-01-06 MED ORDER — FENOFIBRATE 145 MG PO TABS: 145.0000 mg | ORAL_TABLET | Freq: Every day | ORAL | 1 refills | Status: DC

## 2017-01-06 MED ORDER — VITAMIN D (ERGOCALCIFEROL) 1.25 MG (50000 UNIT) PO CAPS: 50000.0000 [IU] | ORAL_CAPSULE | ORAL | 3 refills | Status: DC

## 2017-01-07 ENCOUNTER — Telehealth: Payer: Self-pay | Admitting: Family

## 2017-01-07 NOTE — Telephone Encounter (Signed)
Wife states that patient Is not having any fever, dysuria or cough. Wife would like to know if Michael Horton would like to do any further testing since his WBC was elevated? Wife also wanted you aware that patient Is not going to start the fenofibrate, instead he is going to diet and exercise.

## 2017-01-07 NOTE — Telephone Encounter (Signed)
Have a lot of concerns with labs .Marland KitchenMarland Kitchenplz call pt

## 2017-01-07 NOTE — Telephone Encounter (Signed)
WBC elevation could be related to a viral infection. It is stable at this time, but if he has any other symptoms to call office. Chest x-ray was negative.

## 2017-01-08 NOTE — Telephone Encounter (Signed)
Pt aware of Provider feedback.

## 2017-01-27 ENCOUNTER — Encounter (HOSPITAL_COMMUNITY): Payer: Self-pay | Admitting: Emergency Medicine

## 2017-01-27 ENCOUNTER — Emergency Department (HOSPITAL_COMMUNITY): Payer: Medicare HMO

## 2017-01-27 ENCOUNTER — Emergency Department (HOSPITAL_COMMUNITY)
Admission: EM | Admit: 2017-01-27 | Discharge: 2017-01-27 | Payer: Medicare HMO | Attending: Emergency Medicine | Admitting: Emergency Medicine

## 2017-01-27 DIAGNOSIS — S82401A Unspecified fracture of shaft of right fibula, initial encounter for closed fracture: Secondary | ICD-10-CM

## 2017-01-27 DIAGNOSIS — S82451A Displaced comminuted fracture of shaft of right fibula, initial encounter for closed fracture: Secondary | ICD-10-CM | POA: Diagnosis not present

## 2017-01-27 DIAGNOSIS — S82234A Nondisplaced oblique fracture of shaft of right tibia, initial encounter for closed fracture: Secondary | ICD-10-CM | POA: Diagnosis not present

## 2017-01-27 DIAGNOSIS — Z79899 Other long term (current) drug therapy: Secondary | ICD-10-CM | POA: Insufficient documentation

## 2017-01-27 DIAGNOSIS — M1611 Unilateral primary osteoarthritis, right hip: Secondary | ICD-10-CM | POA: Diagnosis not present

## 2017-01-27 DIAGNOSIS — Z23 Encounter for immunization: Secondary | ICD-10-CM | POA: Diagnosis not present

## 2017-01-27 DIAGNOSIS — S82841A Displaced bimalleolar fracture of right lower leg, initial encounter for closed fracture: Secondary | ICD-10-CM | POA: Diagnosis not present

## 2017-01-27 DIAGNOSIS — W231XXA Caught, crushed, jammed, or pinched between stationary objects, initial encounter: Secondary | ICD-10-CM | POA: Diagnosis not present

## 2017-01-27 DIAGNOSIS — S82461A Displaced segmental fracture of shaft of right fibula, initial encounter for closed fracture: Secondary | ICD-10-CM | POA: Diagnosis not present

## 2017-01-27 DIAGNOSIS — S82831A Other fracture of upper and lower end of right fibula, initial encounter for closed fracture: Secondary | ICD-10-CM | POA: Diagnosis not present

## 2017-01-27 DIAGNOSIS — M19071 Primary osteoarthritis, right ankle and foot: Secondary | ICD-10-CM | POA: Diagnosis not present

## 2017-01-27 DIAGNOSIS — Y929 Unspecified place or not applicable: Secondary | ICD-10-CM | POA: Diagnosis not present

## 2017-01-27 DIAGNOSIS — Z7982 Long term (current) use of aspirin: Secondary | ICD-10-CM | POA: Insufficient documentation

## 2017-01-27 DIAGNOSIS — M7751 Other enthesopathy of right foot: Secondary | ICD-10-CM | POA: Diagnosis not present

## 2017-01-27 DIAGNOSIS — Y999 Unspecified external cause status: Secondary | ICD-10-CM | POA: Insufficient documentation

## 2017-01-27 DIAGNOSIS — M898X6 Other specified disorders of bone, lower leg: Secondary | ICD-10-CM | POA: Diagnosis not present

## 2017-01-27 DIAGNOSIS — Y9389 Activity, other specified: Secondary | ICD-10-CM | POA: Insufficient documentation

## 2017-01-27 DIAGNOSIS — M25461 Effusion, right knee: Secondary | ICD-10-CM | POA: Diagnosis not present

## 2017-01-27 DIAGNOSIS — F17210 Nicotine dependence, cigarettes, uncomplicated: Secondary | ICD-10-CM | POA: Diagnosis not present

## 2017-01-27 DIAGNOSIS — R52 Pain, unspecified: Secondary | ICD-10-CM | POA: Diagnosis not present

## 2017-01-27 DIAGNOSIS — M533 Sacrococcygeal disorders, not elsewhere classified: Secondary | ICD-10-CM | POA: Diagnosis not present

## 2017-01-27 DIAGNOSIS — M79604 Pain in right leg: Secondary | ICD-10-CM | POA: Diagnosis not present

## 2017-01-27 DIAGNOSIS — E785 Hyperlipidemia, unspecified: Secondary | ICD-10-CM | POA: Diagnosis not present

## 2017-01-27 DIAGNOSIS — Z9981 Dependence on supplemental oxygen: Secondary | ICD-10-CM | POA: Diagnosis not present

## 2017-01-27 DIAGNOSIS — S8254XA Nondisplaced fracture of medial malleolus of right tibia, initial encounter for closed fracture: Secondary | ICD-10-CM | POA: Diagnosis not present

## 2017-01-27 DIAGNOSIS — S82141A Displaced bicondylar fracture of right tibia, initial encounter for closed fracture: Secondary | ICD-10-CM | POA: Diagnosis not present

## 2017-01-27 DIAGNOSIS — S82891A Other fracture of right lower leg, initial encounter for closed fracture: Secondary | ICD-10-CM | POA: Diagnosis not present

## 2017-01-27 DIAGNOSIS — S99911A Unspecified injury of right ankle, initial encounter: Secondary | ICD-10-CM | POA: Diagnosis present

## 2017-01-27 DIAGNOSIS — S8261XA Displaced fracture of lateral malleolus of right fibula, initial encounter for closed fracture: Secondary | ICD-10-CM | POA: Insufficient documentation

## 2017-01-27 DIAGNOSIS — S8990XA Unspecified injury of unspecified lower leg, initial encounter: Secondary | ICD-10-CM | POA: Diagnosis not present

## 2017-01-27 DIAGNOSIS — F419 Anxiety disorder, unspecified: Secondary | ICD-10-CM | POA: Diagnosis not present

## 2017-01-27 DIAGNOSIS — S8251XA Displaced fracture of medial malleolus of right tibia, initial encounter for closed fracture: Secondary | ICD-10-CM | POA: Diagnosis not present

## 2017-01-27 DIAGNOSIS — K219 Gastro-esophageal reflux disease without esophagitis: Secondary | ICD-10-CM | POA: Diagnosis not present

## 2017-01-27 DIAGNOSIS — S82201A Unspecified fracture of shaft of right tibia, initial encounter for closed fracture: Secondary | ICD-10-CM

## 2017-01-27 LAB — CBC WITH DIFFERENTIAL/PLATELET
Basophils Absolute: 0 10*3/uL (ref 0.0–0.1)
Basophils Relative: 0 %
Eosinophils Absolute: 0 10*3/uL (ref 0.0–0.7)
Eosinophils Relative: 0 %
HCT: 39.3 % (ref 39.0–52.0)
Hemoglobin: 12.7 g/dL — ABNORMAL LOW (ref 13.0–17.0)
Lymphocytes Relative: 4 %
Lymphs Abs: 0.9 10*3/uL (ref 0.7–4.0)
MCH: 27.1 pg (ref 26.0–34.0)
MCHC: 32.3 g/dL (ref 30.0–36.0)
MCV: 83.8 fL (ref 78.0–100.0)
Monocytes Absolute: 0.8 10*3/uL (ref 0.1–1.0)
Monocytes Relative: 4 %
Neutro Abs: 19.7 10*3/uL — ABNORMAL HIGH (ref 1.7–7.7)
Neutrophils Relative %: 92 %
PLATELETS: 213 10*3/uL (ref 150–400)
RBC: 4.69 MIL/uL (ref 4.22–5.81)
RDW: 17.7 % — ABNORMAL HIGH (ref 11.5–15.5)
WBC: 21.4 10*3/uL — AB (ref 4.0–10.5)

## 2017-01-27 LAB — BASIC METABOLIC PANEL
Anion gap: 6 (ref 5–15)
BUN: 16 mg/dL (ref 6–20)
CHLORIDE: 104 mmol/L (ref 101–111)
CO2: 27 mmol/L (ref 22–32)
Calcium: 9.4 mg/dL (ref 8.9–10.3)
Creatinine, Ser: 0.94 mg/dL (ref 0.61–1.24)
GFR calc Af Amer: >60 mL/min (ref >60)
GFR calc non Af Amer: >60 mL/min (ref >60)
Glucose, Bld: 102 mg/dL — ABNORMAL HIGH (ref 65–99)
POTASSIUM: 4.4 mmol/L (ref 3.5–5.1)
SODIUM: 137 mmol/L (ref 135–145)

## 2017-01-27 MED ORDER — DIAZEPAM 5 MG/ML IJ SOLN
5.0000 mg | Freq: Once | INTRAMUSCULAR | Status: AC
  Administered 2017-01-27: 5 mg via INTRAVENOUS
  Filled 2017-01-27: qty 2

## 2017-01-27 MED ORDER — ONDANSETRON HCL 4 MG/2ML IJ SOLN
4.0000 mg | Freq: Once | INTRAMUSCULAR | Status: AC
  Administered 2017-01-27: 4 mg via INTRAVENOUS
  Filled 2017-01-27: qty 2

## 2017-01-27 MED ORDER — HYDROMORPHONE HCL 1 MG/ML IJ SOLN
1.0000 mg | Freq: Once | INTRAMUSCULAR | Status: AC
  Administered 2017-01-27: 1 mg via INTRAVENOUS
  Filled 2017-01-27: qty 1

## 2017-01-27 MED ORDER — SODIUM CHLORIDE 0.9 % IV SOLN
INTRAVENOUS | Status: DC
  Administered 2017-01-27: 17:00:00 via INTRAVENOUS

## 2017-01-27 MED ORDER — TETANUS-DIPHTH-ACELL PERTUSSIS 5-2.5-18.5 LF-MCG/0.5 IM SUSP
0.5000 mL | Freq: Once | INTRAMUSCULAR | Status: AC
Start: 2017-01-27 — End: 2017-01-27
  Administered 2017-01-27: 0.5 mL via INTRAMUSCULAR
  Filled 2017-01-27: qty 0.5

## 2017-01-27 NOTE — ED Provider Notes (Signed)
[facility] DEPT Provider Note   CSN: [id number] Arrival date & time: 01/27/17  1350     History   Chief Complaint Chief Complaint  Patient presents with  . Leg Injury    tractor implement injury    HPI Michael Horton is a 71 y.o. male.Nurses he is in by    Patient brought in by EMS. Patient was starting the [place] on the tractor when it caught his pant leg pulled his leg in. The automatic shut off did engage in stop to tell her. But his legs are a partially pulled in. Patient with complaint of pain to the calf and lateral aspect of the calf. Has some abrasions there. Is not sure when his last tetanus was. Denies any other injuries.      Past Medical History:  Diagnosis Date  . Anxiety     Patient Active Problem List   Diagnosis Date Noted  . Vitamin D deficiency 01/06/2017  . Hypertriglyceridemia 01/06/2017  . GERD (gastroesophageal reflux disease) 01/02/2017  . Current smoker 01/02/2017  . Generalized anxiety disorder 01/01/2014    Past Surgical History:  Procedure Laterality Date  . HERNIA REPAIR    . SPINE SURGERY         Home Medications    Prior to Admission medications   Medication Sig Start Date End Date Taking? Authorizing Provider  aspirin EC 81 MG tablet Take 1 tablet (81 mg total) by mouth daily. 01/02/17  Yes Sharion Balloon, FNP  citalopram (CELEXA) 20 MG tablet Take 1 tablet (20 mg total) by mouth daily. 01/02/17  Yes Sharion Balloon, FNP  omeprazole (PRILOSEC) 20 MG capsule Take 20 mg by mouth daily.   Yes Historical Provider, MD  Vitamin D, Ergocalciferol, (DRISDOL) 50000 units CAPS capsule Take 1 capsule (50,000 Units total) by mouth every 7 (seven) days. 01/06/17  Yes Sharion Balloon, FNP  fenofibrate (TRICOR) 145 MG tablet Take 1 tablet (145 mg total) by mouth daily. Patient not taking: Reported on 01/27/2017 01/06/17   Sharion Balloon, FNP  varenicline (CHANTIX CONTINUING MONTH PAK) 1 MG tablet Take 1 tablet (1 mg total) by mouth 2 (two) times  daily. Patient not taking: Reported on 01/27/2017 01/02/17   Sharion Balloon, FNP  varenicline (CHANTIX STARTING MONTH PAK) 0.5 MG X 11 & 1 MG X 42 tablet Take one 0.5 mg tablet by mouth once daily for 3 days, then increase to one 0.5 mg tablet twice daily for 4 days, then increase to one 1 mg tablet twice daily. Patient not taking: Reported on 01/27/2017 01/02/17   Sharion Balloon, FNP    Family History Family History  Problem Relation Age of Onset  . Dementia Mother   . Heart disease Mother   . Hypertension Father     Social History Social History  Substance Use Topics  . Smoking status: Current Every Day Smoker    Packs/day: 1.00    Types: Cigarettes  . Smokeless tobacco: Never Used  . Alcohol use No     Comment: rare     Allergies   Patient has no known allergies.   Review of Systems Review of Systems  Constitutional: Negative for fever.  HENT: Negative for congestion.   Eyes: Negative for visual disturbance.  Respiratory: Negative for shortness of breath.   Cardiovascular: Negative for chest pain.  Gastrointestinal: Negative for abdominal pain.  Musculoskeletal: Negative for back pain and neck pain.  Skin: Positive for wound.  Neurological: Negative for headaches.  Hematological: Does not bruise/bleed easily.  Psychiatric/Behavioral: Negative for confusion.     Physical Exam Updated Vital Signs BP 120/77   Pulse 62   Temp 97.5 F (36.4 C) (Oral)   Resp 18   Ht '5\' 9"'$  (1.753 m)   Wt 78 kg   SpO2 96%   BMI 25.40 kg/m   Physical Exam  Constitutional: He is oriented to person, place, and time. He appears well-developed and well-nourished. No distress.  HENT:  Head: Normocephalic and atraumatic.  Mouth/Throat: Oropharynx is clear and moist.  Eyes: Conjunctivae and EOM are normal. Pupils are equal, round, and reactive to light.  Neck: Normal range of motion.  Cardiovascular: Normal rate, regular rhythm and normal heart sounds.   Pulmonary/Chest: Effort normal  and breath sounds normal. No respiratory distress.  Abdominal: Soft. Bowel sounds are normal. There is no tenderness.  Musculoskeletal: He exhibits edema and tenderness.  Patient was swelling to the proximal leg and lateral calf area. Good cap refill to the toes. Also swelling around the ankle mortise a lateral aspect. Abrasions to the upper one third lateral aspect of the calf nothing deep. Some bleeding no suturing required. Does not appear to be an open fracture. No evidence of any joint effusion at the knee.  Neurological: He is alert and oriented to person, place, and time. No cranial nerve deficit or sensory deficit. He exhibits normal muscle tone. Coordination normal.  Skin: Capillary refill takes less than 2 seconds.  Nursing note and vitals reviewed.    ED Treatments / Results  Labs (all labs ordered are listed, but only abnormal results are displayed) Labs Reviewed  CBC WITH DIFFERENTIAL/PLATELET - Abnormal; Notable for the following:       Result Value   WBC 21.4 (*)    Hemoglobin 12.7 (*)    RDW 17.7 (*)    Neutro Abs 19.7 (*)    All other components within normal limits  BASIC METABOLIC PANEL - Abnormal; Notable for the following:    Glucose, Bld 102 (*)    All other components within normal limits    EKG  EKG Interpretation None       Radiology Dg Tibia/fibula Right  Result Date: 01/27/2017 CLINICAL DATA:  Caught RIGHT lower leg in tractor teller when it engaged, pain and swelling RIGHT lower leg EXAM: RIGHT TIBIA AND FIBULA - 2 VIEW COMPARISON:  None FINDINGS: Osseous demineralization. Patella and femur appear intact. Comminuted displaced fracture of the proximal RIGHT fibular diaphysis. Comminuted minimally displaced oblique metaphyseal fracture of the proximal RIGHT tibia extending intra-articular at the medial tibial plateau. Associated soft tissue swelling. No significant knee joint effusion. Additionally, a nondisplaced intra-articular oblique fracture through  the medial aspect of the tibial plafond is noted. Soft tissue swelling at ankle with fracture of the distal lateral malleolus. No definite tarsal fractures seen. IMPRESSION: Comminuted proximal RIGHT fibular diaphyseal fracture, mildly displaced. Comminuted oblique proximal RIGHT tibial metaphyseal fracture with intra-articular extension at the medial tibial plateau. Lateral malleolar fracture RIGHT ankle. Nondisplaced oblique intra-articular fracture at the distal RIGHT tibial plafond. Electronically Signed   By: Lavonia Dana M.D.   On: 01/27/2017 15:32    Procedures Procedures (including critical care time)  Medications Ordered in ED Medications  0.9 %  sodium chloride infusion ( Intravenous New Bag/Given 01/27/17 1642)  HYDROmorphone (DILAUDID) injection 1 mg (1 mg Intravenous Given 01/27/17 1635)  ondansetron (ZOFRAN) injection 4 mg (4 mg Intravenous Given 01/27/17 1635)  Tdap (BOOSTRIX) injection 0.5 mL (0.5 mLs  Intramuscular Given 01/27/17 1642)  diazepam (VALIUM) injection 5 mg (5 mg Intravenous Given 01/27/17 1635)     Initial Impression / Assessment and Plan / ED Course  I have reviewed the triage vital signs and the nursing notes.  Pertinent labs & imaging results that were available during my care of the patient were reviewed by me and considered in my medical decision making (see chart for details).    Patient with comminuted proximal tibia fracture and lateral fibula fracture distal medial malleolus fracture. All secondary to a farm implement. Patient with abrasions laterally but nothing consistent with an open fracture. Discussed with Dr. Carlyle Basques orthopedics felt it was too complicated for them recommended [facility]. Talk to Dr. Junita Push at [facility] he has accepted the patient [facility] will transfer.  Patient's tetanus updated basic labs ordered patient we placed in a long leg splint for transport. Radiology will send this with x-rays. Patient otherwise stable. Do not feel  wounds require antibiotics.  The tibial fracture does go into the plateau.  Final Clinical Impressions(s) / ED Diagnoses   Final diagnoses:  Tibia/fibula fracture, right, closed, initial encounter    New Prescriptions New Prescriptions   No medications on file     Fredia Sorrow, MD 01/27/17 1725

## 2017-01-27 NOTE — ED Triage Notes (Signed)
Pt reports working on the tractor and the tiller caught his pants leg and pulled his right leg under it.  Swelling and small laceration noted.

## 2017-02-04 DIAGNOSIS — S82131A Displaced fracture of medial condyle of right tibia, initial encounter for closed fracture: Secondary | ICD-10-CM | POA: Diagnosis not present

## 2017-02-04 DIAGNOSIS — S8251XA Displaced fracture of medial malleolus of right tibia, initial encounter for closed fracture: Secondary | ICD-10-CM | POA: Diagnosis not present

## 2017-02-04 DIAGNOSIS — M7751 Other enthesopathy of right foot: Secondary | ICD-10-CM | POA: Diagnosis not present

## 2017-02-04 DIAGNOSIS — S82201A Unspecified fracture of shaft of right tibia, initial encounter for closed fracture: Secondary | ICD-10-CM | POA: Diagnosis not present

## 2017-02-04 DIAGNOSIS — M19071 Primary osteoarthritis, right ankle and foot: Secondary | ICD-10-CM | POA: Diagnosis not present

## 2017-02-04 DIAGNOSIS — S82401A Unspecified fracture of shaft of right fibula, initial encounter for closed fracture: Secondary | ICD-10-CM | POA: Diagnosis not present

## 2017-02-04 DIAGNOSIS — S82141A Displaced bicondylar fracture of right tibia, initial encounter for closed fracture: Secondary | ICD-10-CM | POA: Diagnosis not present

## 2017-02-04 DIAGNOSIS — S8261XD Displaced fracture of lateral malleolus of right fibula, subsequent encounter for closed fracture with routine healing: Secondary | ICD-10-CM | POA: Diagnosis not present

## 2017-02-04 DIAGNOSIS — S82451A Displaced comminuted fracture of shaft of right fibula, initial encounter for closed fracture: Secondary | ICD-10-CM | POA: Diagnosis not present

## 2017-02-25 DIAGNOSIS — S82451D Displaced comminuted fracture of shaft of right fibula, subsequent encounter for closed fracture with routine healing: Secondary | ICD-10-CM | POA: Diagnosis not present

## 2017-02-25 DIAGNOSIS — S82891D Other fracture of right lower leg, subsequent encounter for closed fracture with routine healing: Secondary | ICD-10-CM | POA: Diagnosis not present

## 2017-02-25 DIAGNOSIS — S82141D Displaced bicondylar fracture of right tibia, subsequent encounter for closed fracture with routine healing: Secondary | ICD-10-CM | POA: Diagnosis not present

## 2017-02-25 DIAGNOSIS — Z9889 Other specified postprocedural states: Secondary | ICD-10-CM | POA: Diagnosis not present

## 2017-03-01 DIAGNOSIS — S82141A Displaced bicondylar fracture of right tibia, initial encounter for closed fracture: Secondary | ICD-10-CM | POA: Diagnosis not present

## 2017-03-19 DIAGNOSIS — S82451D Displaced comminuted fracture of shaft of right fibula, subsequent encounter for closed fracture with routine healing: Secondary | ICD-10-CM | POA: Diagnosis not present

## 2017-03-19 DIAGNOSIS — S8261XD Displaced fracture of lateral malleolus of right fibula, subsequent encounter for closed fracture with routine healing: Secondary | ICD-10-CM | POA: Diagnosis not present

## 2017-03-19 DIAGNOSIS — S8251XD Displaced fracture of medial malleolus of right tibia, subsequent encounter for closed fracture with routine healing: Secondary | ICD-10-CM | POA: Diagnosis not present

## 2017-03-19 DIAGNOSIS — S82141D Displaced bicondylar fracture of right tibia, subsequent encounter for closed fracture with routine healing: Secondary | ICD-10-CM | POA: Diagnosis not present

## 2017-03-19 DIAGNOSIS — S82891D Other fracture of right lower leg, subsequent encounter for closed fracture with routine healing: Secondary | ICD-10-CM | POA: Diagnosis not present

## 2017-03-31 DIAGNOSIS — R2689 Other abnormalities of gait and mobility: Secondary | ICD-10-CM | POA: Diagnosis not present

## 2017-03-31 DIAGNOSIS — M25661 Stiffness of right knee, not elsewhere classified: Secondary | ICD-10-CM | POA: Diagnosis not present

## 2017-03-31 DIAGNOSIS — M25561 Pain in right knee: Secondary | ICD-10-CM | POA: Diagnosis not present

## 2017-03-31 DIAGNOSIS — M25671 Stiffness of right ankle, not elsewhere classified: Secondary | ICD-10-CM | POA: Diagnosis not present

## 2017-04-01 DIAGNOSIS — S82141A Displaced bicondylar fracture of right tibia, initial encounter for closed fracture: Secondary | ICD-10-CM | POA: Diagnosis not present

## 2017-04-03 DIAGNOSIS — M25661 Stiffness of right knee, not elsewhere classified: Secondary | ICD-10-CM | POA: Diagnosis not present

## 2017-04-03 DIAGNOSIS — M25561 Pain in right knee: Secondary | ICD-10-CM | POA: Diagnosis not present

## 2017-04-03 DIAGNOSIS — M25671 Stiffness of right ankle, not elsewhere classified: Secondary | ICD-10-CM | POA: Diagnosis not present

## 2017-04-03 DIAGNOSIS — R2689 Other abnormalities of gait and mobility: Secondary | ICD-10-CM | POA: Diagnosis not present

## 2017-04-07 DIAGNOSIS — M25661 Stiffness of right knee, not elsewhere classified: Secondary | ICD-10-CM | POA: Diagnosis not present

## 2017-04-07 DIAGNOSIS — R2689 Other abnormalities of gait and mobility: Secondary | ICD-10-CM | POA: Diagnosis not present

## 2017-04-07 DIAGNOSIS — M25671 Stiffness of right ankle, not elsewhere classified: Secondary | ICD-10-CM | POA: Diagnosis not present

## 2017-04-07 DIAGNOSIS — M25561 Pain in right knee: Secondary | ICD-10-CM | POA: Diagnosis not present

## 2017-04-10 DIAGNOSIS — M25671 Stiffness of right ankle, not elsewhere classified: Secondary | ICD-10-CM | POA: Diagnosis not present

## 2017-04-10 DIAGNOSIS — R2689 Other abnormalities of gait and mobility: Secondary | ICD-10-CM | POA: Diagnosis not present

## 2017-04-10 DIAGNOSIS — M25661 Stiffness of right knee, not elsewhere classified: Secondary | ICD-10-CM | POA: Diagnosis not present

## 2017-04-10 DIAGNOSIS — M25561 Pain in right knee: Secondary | ICD-10-CM | POA: Diagnosis not present

## 2017-04-14 DIAGNOSIS — M25561 Pain in right knee: Secondary | ICD-10-CM | POA: Diagnosis not present

## 2017-04-14 DIAGNOSIS — M25671 Stiffness of right ankle, not elsewhere classified: Secondary | ICD-10-CM | POA: Diagnosis not present

## 2017-04-14 DIAGNOSIS — M25661 Stiffness of right knee, not elsewhere classified: Secondary | ICD-10-CM | POA: Diagnosis not present

## 2017-04-14 DIAGNOSIS — R2689 Other abnormalities of gait and mobility: Secondary | ICD-10-CM | POA: Diagnosis not present

## 2017-04-16 DIAGNOSIS — R2689 Other abnormalities of gait and mobility: Secondary | ICD-10-CM | POA: Diagnosis not present

## 2017-04-16 DIAGNOSIS — M25661 Stiffness of right knee, not elsewhere classified: Secondary | ICD-10-CM | POA: Diagnosis not present

## 2017-04-16 DIAGNOSIS — M25561 Pain in right knee: Secondary | ICD-10-CM | POA: Diagnosis not present

## 2017-04-16 DIAGNOSIS — M25671 Stiffness of right ankle, not elsewhere classified: Secondary | ICD-10-CM | POA: Diagnosis not present

## 2017-04-21 DIAGNOSIS — M25661 Stiffness of right knee, not elsewhere classified: Secondary | ICD-10-CM | POA: Diagnosis not present

## 2017-04-21 DIAGNOSIS — M25671 Stiffness of right ankle, not elsewhere classified: Secondary | ICD-10-CM | POA: Diagnosis not present

## 2017-04-21 DIAGNOSIS — S8263XD Displaced fracture of lateral malleolus of unspecified fibula, subsequent encounter for closed fracture with routine healing: Secondary | ICD-10-CM | POA: Diagnosis not present

## 2017-04-21 DIAGNOSIS — R2689 Other abnormalities of gait and mobility: Secondary | ICD-10-CM | POA: Diagnosis not present

## 2017-04-21 DIAGNOSIS — M25561 Pain in right knee: Secondary | ICD-10-CM | POA: Diagnosis not present

## 2017-04-21 DIAGNOSIS — S8251XD Displaced fracture of medial malleolus of right tibia, subsequent encounter for closed fracture with routine healing: Secondary | ICD-10-CM | POA: Diagnosis not present

## 2017-04-21 DIAGNOSIS — S82831D Other fracture of upper and lower end of right fibula, subsequent encounter for closed fracture with routine healing: Secondary | ICD-10-CM | POA: Diagnosis not present

## 2017-04-21 DIAGNOSIS — S82141D Displaced bicondylar fracture of right tibia, subsequent encounter for closed fracture with routine healing: Secondary | ICD-10-CM | POA: Diagnosis not present

## 2017-04-21 DIAGNOSIS — S82451D Displaced comminuted fracture of shaft of right fibula, subsequent encounter for closed fracture with routine healing: Secondary | ICD-10-CM | POA: Diagnosis not present

## 2017-04-21 DIAGNOSIS — S8261XA Displaced fracture of lateral malleolus of right fibula, initial encounter for closed fracture: Secondary | ICD-10-CM | POA: Diagnosis not present

## 2017-04-21 DIAGNOSIS — F17210 Nicotine dependence, cigarettes, uncomplicated: Secondary | ICD-10-CM | POA: Diagnosis not present

## 2017-04-23 DIAGNOSIS — M25661 Stiffness of right knee, not elsewhere classified: Secondary | ICD-10-CM | POA: Diagnosis not present

## 2017-04-23 DIAGNOSIS — M25561 Pain in right knee: Secondary | ICD-10-CM | POA: Diagnosis not present

## 2017-04-23 DIAGNOSIS — R2689 Other abnormalities of gait and mobility: Secondary | ICD-10-CM | POA: Diagnosis not present

## 2017-04-23 DIAGNOSIS — M25671 Stiffness of right ankle, not elsewhere classified: Secondary | ICD-10-CM | POA: Diagnosis not present

## 2017-04-28 DIAGNOSIS — M25561 Pain in right knee: Secondary | ICD-10-CM | POA: Diagnosis not present

## 2017-04-28 DIAGNOSIS — M25671 Stiffness of right ankle, not elsewhere classified: Secondary | ICD-10-CM | POA: Diagnosis not present

## 2017-04-28 DIAGNOSIS — M25661 Stiffness of right knee, not elsewhere classified: Secondary | ICD-10-CM | POA: Diagnosis not present

## 2017-04-28 DIAGNOSIS — R2689 Other abnormalities of gait and mobility: Secondary | ICD-10-CM | POA: Diagnosis not present

## 2017-04-30 DIAGNOSIS — M25671 Stiffness of right ankle, not elsewhere classified: Secondary | ICD-10-CM | POA: Diagnosis not present

## 2017-04-30 DIAGNOSIS — M25561 Pain in right knee: Secondary | ICD-10-CM | POA: Diagnosis not present

## 2017-04-30 DIAGNOSIS — R2689 Other abnormalities of gait and mobility: Secondary | ICD-10-CM | POA: Diagnosis not present

## 2017-04-30 DIAGNOSIS — M25661 Stiffness of right knee, not elsewhere classified: Secondary | ICD-10-CM | POA: Diagnosis not present

## 2017-05-05 DIAGNOSIS — M25661 Stiffness of right knee, not elsewhere classified: Secondary | ICD-10-CM | POA: Diagnosis not present

## 2017-05-05 DIAGNOSIS — M25561 Pain in right knee: Secondary | ICD-10-CM | POA: Diagnosis not present

## 2017-05-05 DIAGNOSIS — M25671 Stiffness of right ankle, not elsewhere classified: Secondary | ICD-10-CM | POA: Diagnosis not present

## 2017-05-05 DIAGNOSIS — R2689 Other abnormalities of gait and mobility: Secondary | ICD-10-CM | POA: Diagnosis not present

## 2017-05-07 DIAGNOSIS — R2689 Other abnormalities of gait and mobility: Secondary | ICD-10-CM | POA: Diagnosis not present

## 2017-05-07 DIAGNOSIS — M25671 Stiffness of right ankle, not elsewhere classified: Secondary | ICD-10-CM | POA: Diagnosis not present

## 2017-05-07 DIAGNOSIS — M25561 Pain in right knee: Secondary | ICD-10-CM | POA: Diagnosis not present

## 2017-05-07 DIAGNOSIS — M25661 Stiffness of right knee, not elsewhere classified: Secondary | ICD-10-CM | POA: Diagnosis not present

## 2017-05-12 DIAGNOSIS — M25671 Stiffness of right ankle, not elsewhere classified: Secondary | ICD-10-CM | POA: Diagnosis not present

## 2017-05-12 DIAGNOSIS — M25561 Pain in right knee: Secondary | ICD-10-CM | POA: Diagnosis not present

## 2017-05-12 DIAGNOSIS — R2689 Other abnormalities of gait and mobility: Secondary | ICD-10-CM | POA: Diagnosis not present

## 2017-05-12 DIAGNOSIS — M25661 Stiffness of right knee, not elsewhere classified: Secondary | ICD-10-CM | POA: Diagnosis not present

## 2017-05-15 DIAGNOSIS — M25661 Stiffness of right knee, not elsewhere classified: Secondary | ICD-10-CM | POA: Diagnosis not present

## 2017-05-15 DIAGNOSIS — M25671 Stiffness of right ankle, not elsewhere classified: Secondary | ICD-10-CM | POA: Diagnosis not present

## 2017-05-15 DIAGNOSIS — R2689 Other abnormalities of gait and mobility: Secondary | ICD-10-CM | POA: Diagnosis not present

## 2017-05-15 DIAGNOSIS — M25561 Pain in right knee: Secondary | ICD-10-CM | POA: Diagnosis not present

## 2017-05-20 DIAGNOSIS — M25661 Stiffness of right knee, not elsewhere classified: Secondary | ICD-10-CM | POA: Diagnosis not present

## 2017-05-20 DIAGNOSIS — M25561 Pain in right knee: Secondary | ICD-10-CM | POA: Diagnosis not present

## 2017-05-20 DIAGNOSIS — M25671 Stiffness of right ankle, not elsewhere classified: Secondary | ICD-10-CM | POA: Diagnosis not present

## 2017-05-20 DIAGNOSIS — R2689 Other abnormalities of gait and mobility: Secondary | ICD-10-CM | POA: Diagnosis not present

## 2017-05-22 DIAGNOSIS — M25561 Pain in right knee: Secondary | ICD-10-CM | POA: Diagnosis not present

## 2017-05-22 DIAGNOSIS — R2689 Other abnormalities of gait and mobility: Secondary | ICD-10-CM | POA: Diagnosis not present

## 2017-05-22 DIAGNOSIS — M25661 Stiffness of right knee, not elsewhere classified: Secondary | ICD-10-CM | POA: Diagnosis not present

## 2017-05-22 DIAGNOSIS — M25671 Stiffness of right ankle, not elsewhere classified: Secondary | ICD-10-CM | POA: Diagnosis not present

## 2017-05-29 DIAGNOSIS — M25561 Pain in right knee: Secondary | ICD-10-CM | POA: Diagnosis not present

## 2017-05-29 DIAGNOSIS — M25661 Stiffness of right knee, not elsewhere classified: Secondary | ICD-10-CM | POA: Diagnosis not present

## 2017-05-29 DIAGNOSIS — M25671 Stiffness of right ankle, not elsewhere classified: Secondary | ICD-10-CM | POA: Diagnosis not present

## 2017-05-29 DIAGNOSIS — R2689 Other abnormalities of gait and mobility: Secondary | ICD-10-CM | POA: Diagnosis not present

## 2017-06-01 DIAGNOSIS — M25661 Stiffness of right knee, not elsewhere classified: Secondary | ICD-10-CM | POA: Diagnosis not present

## 2017-06-01 DIAGNOSIS — M25561 Pain in right knee: Secondary | ICD-10-CM | POA: Diagnosis not present

## 2017-06-01 DIAGNOSIS — R2689 Other abnormalities of gait and mobility: Secondary | ICD-10-CM | POA: Diagnosis not present

## 2017-06-01 DIAGNOSIS — M25671 Stiffness of right ankle, not elsewhere classified: Secondary | ICD-10-CM | POA: Diagnosis not present

## 2017-06-03 DIAGNOSIS — M25561 Pain in right knee: Secondary | ICD-10-CM | POA: Diagnosis not present

## 2017-06-03 DIAGNOSIS — M25661 Stiffness of right knee, not elsewhere classified: Secondary | ICD-10-CM | POA: Diagnosis not present

## 2017-06-03 DIAGNOSIS — M25671 Stiffness of right ankle, not elsewhere classified: Secondary | ICD-10-CM | POA: Diagnosis not present

## 2017-06-03 DIAGNOSIS — R2689 Other abnormalities of gait and mobility: Secondary | ICD-10-CM | POA: Diagnosis not present

## 2017-06-09 DIAGNOSIS — M25561 Pain in right knee: Secondary | ICD-10-CM | POA: Diagnosis not present

## 2017-06-09 DIAGNOSIS — R2689 Other abnormalities of gait and mobility: Secondary | ICD-10-CM | POA: Diagnosis not present

## 2017-06-09 DIAGNOSIS — M25671 Stiffness of right ankle, not elsewhere classified: Secondary | ICD-10-CM | POA: Diagnosis not present

## 2017-06-09 DIAGNOSIS — M25661 Stiffness of right knee, not elsewhere classified: Secondary | ICD-10-CM | POA: Diagnosis not present

## 2017-06-12 DIAGNOSIS — R2689 Other abnormalities of gait and mobility: Secondary | ICD-10-CM | POA: Diagnosis not present

## 2017-06-12 DIAGNOSIS — M25561 Pain in right knee: Secondary | ICD-10-CM | POA: Diagnosis not present

## 2017-06-12 DIAGNOSIS — M25671 Stiffness of right ankle, not elsewhere classified: Secondary | ICD-10-CM | POA: Diagnosis not present

## 2017-06-12 DIAGNOSIS — M25661 Stiffness of right knee, not elsewhere classified: Secondary | ICD-10-CM | POA: Diagnosis not present

## 2017-06-15 DIAGNOSIS — M25671 Stiffness of right ankle, not elsewhere classified: Secondary | ICD-10-CM | POA: Diagnosis not present

## 2017-06-15 DIAGNOSIS — M25561 Pain in right knee: Secondary | ICD-10-CM | POA: Diagnosis not present

## 2017-06-15 DIAGNOSIS — M25661 Stiffness of right knee, not elsewhere classified: Secondary | ICD-10-CM | POA: Diagnosis not present

## 2017-06-15 DIAGNOSIS — R2689 Other abnormalities of gait and mobility: Secondary | ICD-10-CM | POA: Diagnosis not present

## 2017-06-17 DIAGNOSIS — R2689 Other abnormalities of gait and mobility: Secondary | ICD-10-CM | POA: Diagnosis not present

## 2017-06-17 DIAGNOSIS — M25661 Stiffness of right knee, not elsewhere classified: Secondary | ICD-10-CM | POA: Diagnosis not present

## 2017-06-17 DIAGNOSIS — M25671 Stiffness of right ankle, not elsewhere classified: Secondary | ICD-10-CM | POA: Diagnosis not present

## 2017-06-17 DIAGNOSIS — M25561 Pain in right knee: Secondary | ICD-10-CM | POA: Diagnosis not present

## 2017-06-18 DIAGNOSIS — D1801 Hemangioma of skin and subcutaneous tissue: Secondary | ICD-10-CM | POA: Diagnosis not present

## 2017-06-18 DIAGNOSIS — L570 Actinic keratosis: Secondary | ICD-10-CM | POA: Diagnosis not present

## 2017-06-18 DIAGNOSIS — L821 Other seborrheic keratosis: Secondary | ICD-10-CM | POA: Diagnosis not present

## 2017-06-18 DIAGNOSIS — D225 Melanocytic nevi of trunk: Secondary | ICD-10-CM | POA: Diagnosis not present

## 2017-06-23 DIAGNOSIS — R2689 Other abnormalities of gait and mobility: Secondary | ICD-10-CM | POA: Diagnosis not present

## 2017-06-23 DIAGNOSIS — M25661 Stiffness of right knee, not elsewhere classified: Secondary | ICD-10-CM | POA: Diagnosis not present

## 2017-06-23 DIAGNOSIS — M25561 Pain in right knee: Secondary | ICD-10-CM | POA: Diagnosis not present

## 2017-06-23 DIAGNOSIS — M25671 Stiffness of right ankle, not elsewhere classified: Secondary | ICD-10-CM | POA: Diagnosis not present

## 2017-06-25 DIAGNOSIS — M25661 Stiffness of right knee, not elsewhere classified: Secondary | ICD-10-CM | POA: Diagnosis not present

## 2017-06-25 DIAGNOSIS — M25671 Stiffness of right ankle, not elsewhere classified: Secondary | ICD-10-CM | POA: Diagnosis not present

## 2017-06-25 DIAGNOSIS — R2689 Other abnormalities of gait and mobility: Secondary | ICD-10-CM | POA: Diagnosis not present

## 2017-06-25 DIAGNOSIS — M25561 Pain in right knee: Secondary | ICD-10-CM | POA: Diagnosis not present

## 2017-08-06 DIAGNOSIS — Z967 Presence of other bone and tendon implants: Secondary | ICD-10-CM | POA: Diagnosis not present

## 2017-08-06 DIAGNOSIS — S82891D Other fracture of right lower leg, subsequent encounter for closed fracture with routine healing: Secondary | ICD-10-CM | POA: Diagnosis not present

## 2017-08-06 DIAGNOSIS — S8251XD Displaced fracture of medial malleolus of right tibia, subsequent encounter for closed fracture with routine healing: Secondary | ICD-10-CM | POA: Diagnosis not present

## 2017-08-06 DIAGNOSIS — S82141A Displaced bicondylar fracture of right tibia, initial encounter for closed fracture: Secondary | ICD-10-CM | POA: Diagnosis not present

## 2017-08-06 DIAGNOSIS — S82141D Displaced bicondylar fracture of right tibia, subsequent encounter for closed fracture with routine healing: Secondary | ICD-10-CM | POA: Diagnosis not present

## 2017-08-06 DIAGNOSIS — Z4789 Encounter for other orthopedic aftercare: Secondary | ICD-10-CM | POA: Diagnosis not present

## 2018-01-04 ENCOUNTER — Other Ambulatory Visit: Payer: Self-pay | Admitting: Family

## 2018-01-04 DIAGNOSIS — F331 Major depressive disorder, recurrent, moderate: Secondary | ICD-10-CM

## 2018-01-04 DIAGNOSIS — F411 Generalized anxiety disorder: Secondary | ICD-10-CM

## 2018-01-22 ENCOUNTER — Other Ambulatory Visit: Payer: Self-pay | Admitting: Family

## 2018-01-22 DIAGNOSIS — F411 Generalized anxiety disorder: Secondary | ICD-10-CM

## 2018-01-22 DIAGNOSIS — F331 Major depressive disorder, recurrent, moderate: Secondary | ICD-10-CM

## 2018-01-25 ENCOUNTER — Telehealth: Payer: Self-pay | Admitting: Family Medicine

## 2018-03-30 ENCOUNTER — Other Ambulatory Visit: Payer: Self-pay | Admitting: Family

## 2018-03-30 DIAGNOSIS — F331 Major depressive disorder, recurrent, moderate: Secondary | ICD-10-CM

## 2018-03-30 DIAGNOSIS — F411 Generalized anxiety disorder: Secondary | ICD-10-CM

## 2018-11-03 ENCOUNTER — Telehealth: Payer: Self-pay | Admitting: Family

## 2018-11-03 DIAGNOSIS — E781 Pure hyperglyceridemia: Secondary | ICD-10-CM

## 2018-11-03 DIAGNOSIS — Z0000 Encounter for general adult medical examination without abnormal findings: Secondary | ICD-10-CM

## 2018-11-03 DIAGNOSIS — E559 Vitamin D deficiency, unspecified: Secondary | ICD-10-CM

## 2018-11-04 NOTE — Telephone Encounter (Signed)
I am fine with this, but is he going to be seeing me for CPE. If so we need to change his PCP. His current PCP is [facility].

## 2018-11-04 NOTE — Addendum Note (Signed)
Addended by: Rolena Infante on: 11/04/2018 09:14 AM   Modules accepted: Orders

## 2018-12-02 ENCOUNTER — Encounter: Payer: Medicare HMO | Admitting: Family

## 2018-12-02 DIAGNOSIS — Z0000 Encounter for general adult medical examination without abnormal findings: Secondary | ICD-10-CM | POA: Diagnosis not present

## 2018-12-02 DIAGNOSIS — E781 Pure hyperglyceridemia: Secondary | ICD-10-CM | POA: Diagnosis not present

## 2018-12-02 DIAGNOSIS — E559 Vitamin D deficiency, unspecified: Secondary | ICD-10-CM | POA: Diagnosis not present

## 2018-12-03 ENCOUNTER — Other Ambulatory Visit: Payer: Self-pay | Admitting: Family

## 2018-12-03 LAB — CMP14+EGFR
ALK PHOS: 109 IU/L (ref 39–117)
ALT: 9 IU/L (ref 0–44)
AST: 12 IU/L (ref 0–40)
Albumin/Globulin Ratio: 1.9 (ref 1.2–2.2)
Albumin: 4.7 g/dL (ref 3.7–4.7)
BUN / CREAT RATIO: 14 (ref 10–24)
BUN: 13 mg/dL (ref 8–27)
Bilirubin Total: 0.2 mg/dL (ref 0.0–1.2)
CO2: 22 mmol/L (ref 20–29)
Calcium: 10.1 mg/dL (ref 8.6–10.2)
Chloride: 100 mmol/L (ref 96–106)
Creatinine, Ser: 0.92 mg/dL (ref 0.76–1.27)
GFR calc Af Amer: 96 mL/min/{1.73_m2} (ref >59)
GFR, EST NON AFRICAN AMERICAN: 83 mL/min/{1.73_m2} (ref >59)
Globulin, Total: 2.5 g/dL (ref 1.5–4.5)
Glucose: 80 mg/dL (ref 65–99)
Potassium: 5.4 mmol/L — ABNORMAL HIGH (ref 3.5–5.2)
Sodium: 139 mmol/L (ref 134–144)
Total Protein: 7.2 g/dL (ref 6.0–8.5)

## 2018-12-03 LAB — CBC WITH DIFFERENTIAL/PLATELET
BASOS: 1 %
Basophils Absolute: 0.1 10*3/uL (ref 0.0–0.2)
EOS (ABSOLUTE): 0.2 10*3/uL (ref 0.0–0.4)
Eos: 2 %
Hematocrit: 40.3 % (ref 37.5–51.0)
Hemoglobin: 12.5 g/dL — ABNORMAL LOW (ref 13.0–17.7)
Immature Grans (Abs): 0.1 10*3/uL (ref 0.0–0.1)
Immature Granulocytes: 1 %
LYMPHS ABS: 2.5 10*3/uL (ref 0.7–3.1)
Lymphs: 28 %
MCH: 23.3 pg — AB (ref 26.6–33.0)
MCHC: 31 g/dL — AB (ref 31.5–35.7)
MCV: 75 fL — AB (ref 79–97)
MONOS ABS: 0.7 10*3/uL (ref 0.1–0.9)
Monocytes: 8 %
Neutrophils Absolute: 5.5 10*3/uL (ref 1.4–7.0)
Neutrophils: 60 %
Platelets: 283 10*3/uL (ref 150–450)
RBC: 5.36 x10E6/uL (ref 4.14–5.80)
RDW: 18.3 % — AB (ref 11.6–15.4)
WBC: 9 10*3/uL (ref 3.4–10.8)

## 2018-12-03 LAB — THYROID PANEL WITH TSH
Free Thyroxine Index: 1.9 (ref 1.2–4.9)
T3 Uptake Ratio: 25 % (ref 24–39)
T4, Total: 7.4 ug/dL (ref 4.5–12.0)
TSH: 1.61 u[IU]/mL (ref 0.450–4.500)

## 2018-12-03 LAB — LIPID PANEL
Chol/HDL Ratio: 5.9 ratio — ABNORMAL HIGH (ref 0.0–5.0)
Cholesterol, Total: 270 mg/dL — ABNORMAL HIGH (ref 100–199)
HDL: 46 mg/dL (ref >39)
LDL Calculated: 175 mg/dL — ABNORMAL HIGH (ref 0–99)
Triglycerides: 243 mg/dL — ABNORMAL HIGH (ref 0–149)
VLDL Cholesterol Cal: 49 mg/dL — ABNORMAL HIGH (ref 5–40)

## 2018-12-03 LAB — VITAMIN D 25 HYDROXY (VIT D DEFICIENCY, FRACTURES): Vit D, 25-Hydroxy: 26.2 ng/mL — ABNORMAL LOW (ref 30.0–100.0)

## 2018-12-03 MED ORDER — ATORVASTATIN CALCIUM 20 MG PO TABS: 20.0000 mg | ORAL_TABLET | Freq: Every day | ORAL | 3 refills | Status: DC

## 2018-12-14 ENCOUNTER — Other Ambulatory Visit: Payer: Self-pay | Admitting: Family

## 2018-12-14 DIAGNOSIS — F331 Major depressive disorder, recurrent, moderate: Secondary | ICD-10-CM

## 2018-12-14 DIAGNOSIS — F411 Generalized anxiety disorder: Secondary | ICD-10-CM

## 2018-12-14 MED ORDER — CITALOPRAM HYDROBROMIDE 20 MG PO TABS: 20.0000 mg | ORAL_TABLET | Freq: Every day | ORAL | 0 refills | Status: DC
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
## 2018-12-14 NOTE — Telephone Encounter (Signed)
Aware 30 days sent to [facility]

## 2018-12-30 ENCOUNTER — Ambulatory Visit (INDEPENDENT_AMBULATORY_CARE_PROVIDER_SITE_OTHER): Payer: Medicare HMO | Admitting: Family

## 2018-12-30 ENCOUNTER — Encounter: Payer: Self-pay | Admitting: Family

## 2018-12-30 VITALS — BP 127/77 | HR 63 | Temp 97.0°F | Ht 69.0 in | Wt 174.8 lb

## 2018-12-30 DIAGNOSIS — E559 Vitamin D deficiency, unspecified: Secondary | ICD-10-CM

## 2018-12-30 DIAGNOSIS — F17200 Nicotine dependence, unspecified, uncomplicated: Secondary | ICD-10-CM

## 2018-12-30 DIAGNOSIS — Z23 Encounter for immunization: Secondary | ICD-10-CM

## 2018-12-30 DIAGNOSIS — F331 Major depressive disorder, recurrent, moderate: Secondary | ICD-10-CM | POA: Diagnosis not present

## 2018-12-30 DIAGNOSIS — F411 Generalized anxiety disorder: Secondary | ICD-10-CM

## 2018-12-30 DIAGNOSIS — Z0001 Encounter for general adult medical examination with abnormal findings: Secondary | ICD-10-CM | POA: Diagnosis not present

## 2018-12-30 DIAGNOSIS — K219 Gastro-esophageal reflux disease without esophagitis: Secondary | ICD-10-CM

## 2018-12-30 DIAGNOSIS — E781 Pure hyperglyceridemia: Secondary | ICD-10-CM

## 2018-12-30 DIAGNOSIS — Z0000 Encounter for general adult medical examination without abnormal findings: Secondary | ICD-10-CM

## 2018-12-30 MED ORDER — CITALOPRAM HYDROBROMIDE 20 MG PO TABS: 20.0000 mg | ORAL_TABLET | Freq: Every day | ORAL | 4 refills | Status: DC

## 2018-12-30 MED ORDER — OMEPRAZOLE 20 MG PO CPDR: 20.0000 mg | DELAYED_RELEASE_CAPSULE | Freq: Every day | ORAL | 4 refills | Status: DC

## 2018-12-30 NOTE — Progress Notes (Signed)
Subjective:    Patient ID: Michael Horton, male    DOB: 07/24/1946, 73 y.o.   MRN: [id number]  Chief Complaint  Patient presents with  . Annual Exam   PT presents to the office today for CPE.  Gastroesophageal Reflux  He reports no belching, no coughing or no heartburn. This is a chronic problem. The current episode started more than 1 year ago. The problem occurs rarely. The problem has been waxing and waning. The symptoms are aggravated by certain foods. He has tried a PPI for the symptoms. The treatment provided moderate relief.  Anxiety  Presents for follow-up visit. Symptoms include depressed mood, excessive worry, irritability and nervous/anxious behavior. Symptoms occur occasionally. The severity of symptoms is moderate. The quality of sleep is good.    Hyperlipidemia  This is a chronic problem. The current episode started more than 1 year ago. The problem is uncontrolled. Recent lipid tests were reviewed and are high. Current antihyperlipidemic treatment includes diet change. The current treatment provides mild improvement of lipids. Risk factors for coronary artery disease include dyslipidemia, male sex and a sedentary lifestyle.      Review of Systems  Constitutional: Positive for irritability.  Respiratory: Negative for cough.   Gastrointestinal: Negative for heartburn.  Psychiatric/Behavioral: The patient is nervous/anxious.   All other systems reviewed and are negative.  Family History  Problem Relation Age of Onset  . Dementia Mother   . Heart disease Mother   . Hypertension Father     Social History   Socioeconomic History  . Marital status: Married    Spouse name: Not on file  . Number of children: Not on file  . Years of education: Not on file  . Highest education level: Not on file  Occupational History  . Not on file  Social Needs  . Financial resource strain: Not on file  . Food insecurity:    Worry: Not on file    Inability: Not on file  .  Transportation needs:    Medical: Not on file    Non-medical: Not on file  Tobacco Use  . Smoking status: Current Every Day Smoker    Packs/day: 1.00    Types: Cigarettes  . Smokeless tobacco: Never Used  Substance and Sexual Activity  . Alcohol use: No    Comment: rare  . Drug use: No  . Sexual activity: Not on file  Lifestyle  . Physical activity:    Days per week: Not on file    Minutes per session: Not on file  . Stress: Not on file  Relationships  . Social connections:    Talks on phone: Not on file    Gets together: Not on file    Attends religious service: Not on file    Active member of club or organization: Not on file    Attends meetings of clubs or organizations: Not on file    Relationship status: Not on file  Other Topics Concern  . Not on file  Social History Narrative  . Not on file       Objective:   Physical Exam Vitals signs reviewed.  Constitutional:      General: He is not in acute distress.    Appearance: He is well-developed.  HENT:     Head: Normocephalic.     Right Ear: Tympanic membrane normal.     Left Ear: Tympanic membrane normal.  Eyes:     General:        Right  eye: No discharge.        Left eye: No discharge.     Pupils: Pupils are equal, round, and reactive to light.  Neck:     Musculoskeletal: Normal range of motion and neck supple.     Thyroid: No thyromegaly.  Cardiovascular:     Rate and Rhythm: Normal rate and regular rhythm.     Heart sounds: Normal heart sounds. No murmur.  Pulmonary:     Effort: Pulmonary effort is normal. No respiratory distress.     Breath sounds: Rhonchi present. No wheezing.  Abdominal:     General: Bowel sounds are normal. There is no distension.     Palpations: Abdomen is soft.     Tenderness: There is no abdominal tenderness.  Musculoskeletal: Normal range of motion.        General: No tenderness.  Skin:    General: Skin is warm and dry.     Findings: No erythema or rash.  Neurological:       Mental Status: He is alert and oriented to person, place, and time.     Cranial Nerves: No cranial nerve deficit.     Deep Tendon Reflexes: Reflexes are normal and symmetric.  Psychiatric:        Behavior: Behavior normal.        Thought Content: Thought content normal.        Judgment: Judgment normal.     BP 127/77   Pulse 63   Temp (!) 97 F (36.1 C) (Oral)   Ht '5\' 9"'  (1.753 m)   Wt 174 lb 12.8 oz (79.3 kg)   BMI 25.81 kg/m        Assessment & Plan:  Michael Horton comes in today with chief complaint of Annual Exam   Diagnosis and orders addressed:  1. Annual physical exam - CMP14+EGFR - CBC with Differential/Platelet - Lipid panel - TSH - VITAMIN D 25 Hydroxy (Vit-D Deficiency, Fractures)  2. Gastroesophageal reflux disease, esophagitis presence not specified - CMP14+EGFR - CBC with Differential/Platelet  3. Current smoker - CMP14+EGFR - CBC with Differential/Platelet  4. Generalized anxiety disorder - CMP14+EGFR - CBC with Differential/Platelet  5. Hypertriglyceridemia - CMP14+EGFR - CBC with Differential/Platelet - Lipid panel  6. Vitamin D deficiency - CMP14+EGFR - CBC with Differential/Platelet - VITAMIN D 25 Hydroxy (Vit-D Deficiency, Fractures)   Labs pending Health Maintenance reviewed Diet and exercise encouraged  Follow up plan: 6 months    Evelina Dun, FNP

## 2018-12-30 NOTE — Patient Instructions (Signed)
Fat and Cholesterol Restricted Eating Plan Getting too much fat and cholesterol in your diet may cause health problems. Choosing the right foods helps keep your fat and cholesterol at normal levels. This can keep you from getting certain diseases. Your doctor may recommend an eating plan that includes:  Total fat: ______% or less of total calories a day.  Saturated fat: ______% or less of total calories a day.  Cholesterol: less than _________mg a day.  Fiber: ______g a day. What are tips for following this plan? Meal planning  At meals, divide your plate into four equal parts: ? Fill one-half of your plate with vegetables and green salads. ? Fill one-fourth of your plate with whole grains. ? Fill one-fourth of your plate with low-fat (lean) protein foods.  Eat fish that is high in omega-3 fats at least two times a week. This includes mackerel, tuna, sardines, and salmon.  Eat foods that are high in fiber, such as whole grains, beans, apples, broccoli, carrots, peas, and barley. General tips   Work with your doctor to lose weight if you need to.  Avoid: ? Foods with added sugar. ? Fried foods. ? Foods with partially hydrogenated oils.  Limit alcohol intake to no more than 1 drink a day for nonpregnant women and 2 drinks a day for men. One drink equals 12 oz of beer, 5 oz of wine, or 1 oz of hard liquor. Reading food labels  Check food labels for: ? Trans fats. ? Partially hydrogenated oils. ? Saturated fat (g) in each serving. ? Cholesterol (mg) in each serving. ? Fiber (g) in each serving.  Choose foods with healthy fats, such as: ? Monounsaturated fats. ? Polyunsaturated fats. ? Omega-3 fats.  Choose grain products that have whole grains. Look for the word "whole" as the first word in the ingredient list. Cooking  Cook foods using low-fat methods. These include baking, boiling, grilling, and broiling.  Eat more home-cooked foods. Eat at restaurants and buffets  less often.  Avoid cooking using saturated fats, such as butter, cream, palm oil, palm kernel oil, and coconut oil. Recommended foods  Fruits  All fresh, canned (in natural juice), or frozen fruits. Vegetables  Fresh or frozen vegetables (raw, steamed, roasted, or grilled). Green salads. Grains  Whole grains, such as whole wheat or whole grain breads, crackers, cereals, and pasta. Unsweetened oatmeal, bulgur, barley, quinoa, or brown rice. Corn or whole wheat flour tortillas. Meats and other protein foods  Ground beef (85% or leaner), grass-fed beef, or beef trimmed of fat. Skinless chicken or turkey. Ground chicken or turkey. Pork trimmed of fat. All fish and seafood. Egg whites. Dried beans, peas, or lentils. Unsalted nuts or seeds. Unsalted canned beans. Nut butters without added sugar or oil. Dairy  Low-fat or nonfat dairy products, such as skim or 1% milk, 2% or reduced-fat cheeses, low-fat and fat-free ricotta or cottage cheese, or plain low-fat and nonfat yogurt. Fats and oils  Tub margarine without trans fats. Light or reduced-fat mayonnaise and salad dressings. Avocado. Olive, canola, sesame, or safflower oils. The items listed above may not be a complete list of foods and beverages you can eat. Contact a dietitian for more information. Foods to avoid Fruits  Canned fruit in heavy syrup. Fruit in cream or butter sauce. Fried fruit. Vegetables  Vegetables cooked in cheese, cream, or butter sauce. Fried vegetables. Grains  White bread. White pasta. White rice. Cornbread. Bagels, pastries, and croissants. Crackers and snack foods that contain trans fat   and hydrogenated oils. Meats and other protein foods  Fatty cuts of meat. Ribs, chicken wings, bacon, sausage, bologna, salami, chitterlings, fatback, hot dogs, bratwurst, and packaged lunch meats. Liver and organ meats. Whole eggs and egg yolks. Chicken and turkey with skin. Fried meat. Dairy  Whole or 2% milk, cream,  half-and-half, and cream cheese. Whole milk cheeses. Whole-fat or sweetened yogurt. Full-fat cheeses. Nondairy creamers and whipped toppings. Processed cheese, cheese spreads, and cheese curds. Beverages  Alcohol. Sugar-sweetened drinks such as sodas, lemonade, and fruit drinks. Fats and oils  Butter, stick margarine, lard, shortening, ghee, or bacon fat. Coconut, palm kernel, and palm oils. Sweets and desserts  Corn syrup, sugars, honey, and molasses. Candy. Jam and jelly. Syrup. Sweetened cereals. Cookies, pies, cakes, donuts, muffins, and ice cream. The items listed above may not be a complete list of foods and beverages you should avoid. Contact a dietitian for more information. Summary  Choosing the right foods helps keep your fat and cholesterol at normal levels. This can keep you from getting certain diseases.  At meals, fill one-half of your plate with vegetables and green salads.  Eat high-fiber foods, like whole grains, beans, apples, carrots, peas, and barley.  Limit added sugar, saturated fats, alcohol, and fried foods. This information is not intended to replace advice given to you by your health care provider. Make sure you discuss any questions you have with your health care provider. Document Released: 04/13/2012 Document Revised: 06/16/2018 Document Reviewed: 06/30/2017 Elsevier Interactive Patient Education  2019 [employer]   

## 2018-12-30 NOTE — Addendum Note (Signed)
Addended by: Shelbie Ammons on: 12/30/2018 04:50 PM   Modules accepted: Orders

## 2019-03-09 ENCOUNTER — Other Ambulatory Visit: Payer: Self-pay

## 2019-03-09 ENCOUNTER — Ambulatory Visit: Payer: Medicare HMO

## 2019-03-09 NOTE — Progress Notes (Addendum)
Erroneous encounter

## 2019-03-16 ENCOUNTER — Other Ambulatory Visit: Payer: Self-pay

## 2019-03-16 ENCOUNTER — Ambulatory Visit (INDEPENDENT_AMBULATORY_CARE_PROVIDER_SITE_OTHER): Payer: Medicare HMO | Admitting: *Deleted

## 2019-03-16 DIAGNOSIS — Z0000 Encounter for general adult medical examination without abnormal findings: Secondary | ICD-10-CM | POA: Diagnosis not present

## 2019-03-16 NOTE — Patient Instructions (Signed)
Preventive Care 2 Years and Older, Male Preventive care refers to lifestyle choices and visits with your health care provider that can promote health and wellness. What does preventive care include?   A yearly physical exam. This is also called an annual well check.  Dental exams once or twice a year.  Routine eye exams. Ask your health care provider how often you should have your eyes checked.  Personal lifestyle choices, including: ? Daily care of your teeth and gums. ? Regular physical activity. ? Eating a healthy diet. ? Avoiding tobacco and drug use. ? Limiting alcohol use. ? Practicing safe sex. ? Taking low doses of aspirin every day. ? Taking vitamin and mineral supplements as recommended by your health care provider. What happens during an annual well check? The services and screenings done by your health care provider during your annual well check will depend on your age, overall health, lifestyle risk factors, and family history of disease. Counseling Your health care provider may ask you questions about your:  Alcohol use.  Tobacco use.  Drug use.  Emotional well-being.  Home and relationship well-being.  Sexual activity.  Eating habits.  History of falls.  Memory and ability to understand (cognition).  Work and work Statistician. Screening You may have the following tests or measurements:  Height, weight, and BMI.  Blood pressure.  Lipid and cholesterol levels. These may be checked every 5 years, or more frequently if you are over 9 years old.  Skin check.  Lung cancer screening. You may have this screening every year starting at age 57 if you have a 30-pack-year history of smoking and currently smoke or have quit within the past 15 years.  Colorectal cancer screening. All adults should have this screening starting at age 90 and continuing until age 69. You will have tests every 1-10 years, depending on your results and the type of screening  test. People at increased risk should start screening at an earlier age. Screening tests may include: ? Guaiac-based fecal occult blood testing. ? Fecal immunochemical test (FIT). ? Stool DNA test. ? Virtual colonoscopy. ? Sigmoidoscopy. During this test, a flexible tube with a tiny camera (sigmoidoscope) is used to examine your rectum and lower colon. The sigmoidoscope is inserted through your anus into your rectum and lower colon. ? Colonoscopy. During this test, a long, thin, flexible tube with a tiny camera (colonoscope) is used to examine your entire colon and rectum.  Prostate cancer screening. Recommendations will vary depending on your family history and other risks.  Hepatitis C blood test.  Hepatitis B blood test.  Sexually transmitted disease (STD) testing.  Diabetes screening. This is done by checking your blood sugar (glucose) after you have not eaten for a while (fasting). You may have this done every 1-3 years.  Abdominal aortic aneurysm (AAA) screening. You may need this if you are a current or former smoker.  Osteoporosis. You may be screened starting at age 30 if you are at high risk. Talk with your health care provider about your test results, treatment options, and if necessary, the need for more tests. Vaccines Your health care provider may recommend certain vaccines, such as:  Influenza vaccine. This is recommended every year.  Tetanus, diphtheria, and acellular pertussis (Tdap, Td) vaccine. You may need a Td booster every 10 years.  Varicella vaccine. You may need this if you have not been vaccinated.  Zoster vaccine. You may need this after age 42.  Measles, mumps, and rubella (MMR) vaccine.  You may need at least one dose of MMR if you were born in 1957 or later. You may also need a second dose.  Pneumococcal 13-valent conjugate (PCV13) vaccine. One dose is recommended after age 65.  Pneumococcal polysaccharide (PPSV23) vaccine. One dose is recommended  after age 65.  Meningococcal vaccine. You may need this if you have certain conditions.  Hepatitis A vaccine. You may need this if you have certain conditions or if you travel or work in places where you may be exposed to hepatitis A.  Hepatitis B vaccine. You may need this if you have certain conditions or if you travel or work in places where you may be exposed to hepatitis B.  Haemophilus influenzae type b (Hib) vaccine. You may need this if you have certain risk factors. Talk to your health care provider about which screenings and vaccines you need and how often you need them. This information is not intended to replace advice given to you by your health care provider. Make sure you discuss any questions you have with your health care provider. Document Released: 11/09/2015 Document Revised: 12/03/2017 Document Reviewed: 08/14/2015 Elsevier Interactive Patient Education  2019 Elsevier Inc[employer]  

## 2019-03-16 NOTE — Progress Notes (Signed)
MEDICARE ANNUAL WELLNESS VISIT  03/16/2019  Telephone Visit Disclaimer This Medicare AWV was conducted by telephone due to national recommendations for restrictions regarding the COVID-19 Pandemic (e.g. social distancing).  I verified, using two identifiers, that I am speaking with Michael Horton or their authorized healthcare agent. I discussed the limitations, risks, security, and privacy concerns of performing an evaluation and management service by telephone and the potential availability of an in-person appointment in the future. The patient expressed understanding and agreed to proceed.   Subjective:  Michael Horton is a 73 y.o. male patient of Hawks, Theador Hawthorne, FNP who had a Medicare Annual Wellness Visit today via telephone. Michael Horton is Retired and lives with their spouse. he has 3 children. he reports that he is socially active and does interact with friends/family regularly. he is moderately physically active and enjoys gardening.  Patient Care Team: Sharion Balloon, FNP as PCP - General (Family Medicine)  Advanced Directives 03/16/2019 01/27/2017 01/02/2017  Does Patient Have a Medical Advance Directive? No No No  Would patient like information on creating a medical advance directive? No - Patient declined No - Patient declined No - Patient declined    Hospital Utilization Over the Past 12 Months: # of hospitalizations or ER visits: 0 # of surgeries: 0  Review of Systems    Patient reports that his overall health is better compared to last year.  Patient Reported Readings (BP, Pulse, CBG, Weight, etc) none  Review of Systems: No complaints  All other systems negative.  Pain Assessment Pain : No/denies pain     Current Medications & Allergies (verified) Allergies as of 03/16/2019   No Known Allergies     Medication List       Accurate as of Mar 16, 2019  2:34 PM. If you have any questions, ask your nurse or doctor.        STOP taking these medications    aspirin EC 81 MG tablet     TAKE these medications   citalopram 20 MG tablet Commonly known as:  CELEXA Take 1 tablet (20 mg total) by mouth daily.   MULTI VITAMIN DAILY PO Take by mouth.   omeprazole 20 MG capsule Commonly known as:  PRILOSEC Take 1 capsule (20 mg total) by mouth daily.       History (reviewed): Past Medical History:  Diagnosis Date  . Anxiety    Past Surgical History:  Procedure Laterality Date  . ANKLE FRACTURE SURGERY Right 2017  . HERNIA REPAIR    . SPINE SURGERY    . TIBIA FRACTURE SURGERY Right 2017   Family History  Problem Relation Age of Onset  . Dementia Mother   . Heart disease Mother   . Hypertension Father    Social History   Socioeconomic History  . Marital status: Married    Spouse name: [name]  . Number of children: 3  . Years of education: Not on file  . Highest education level: 11th grade  Occupational History  . Occupation: Retired  Scientific laboratory technician  . Financial resource strain: Not hard at all  . Food insecurity:    Worry: Never true    Inability: Never true  . Transportation needs:    Medical: No    Non-medical: No  Tobacco Use  . Smoking status: Current Every Day Smoker    Packs/day: 0.50    Years: 56.00    Pack years: 28.00    Types: Cigarettes  . Smokeless  tobacco: Never Used  Substance and Sexual Activity  . Alcohol use: No    Comment: rare  . Drug use: No  . Sexual activity: Yes    Birth control/protection: None  Lifestyle  . Physical activity:    Days per week: 7 days    Minutes per session: 40 min  . Stress: Not at all  Relationships  . Social connections:    Talks on phone: More than three times a week    Gets together: More than three times a week    Attends religious service: 1 to 4 times per year    Active member of club or organization: No    Attends meetings of clubs or organizations: Never    Relationship status: Married  Other Topics Concern  . Not on file  Social History Narrative   . Not on file    Activities of Daily Living In your present state of health, do you have any difficulty performing the following activities: 03/16/2019  Hearing? Y  Comment getting hard of hearing more so in the left ear  Vision? N  Difficulty concentrating or making decisions? N  Walking or climbing stairs? N  Dressing or bathing? N  Doing errands, shopping? N  Preparing Food and eating ? N  Using the Toilet? N  In the past six months, have you accidently leaked urine? N  Do you have problems with loss of bowel control? N  Managing your Medications? N  Managing your Finances? N  Housekeeping or managing your Housekeeping? N  Some recent data might be hidden    Patient Literacy How often do you need to have someone help you when you read instructions, pamphlets, or other written materials from your doctor or pharmacy?: 1 - Never What is the last grade level you completed in school?: 11th grade  Exercise Current Exercise Habits: Home exercise routine, Time (Minutes): 40, Frequency (Times/Week): 7, Weekly Exercise (Minutes/Week): 280, Intensity: Moderate, Exercise limited by: None identified  Diet Patient reports consuming 3 meals a day and 1 snack(s) a day Patient reports that his primary diet is: Regular Patient reports that she does have regular access to food.   Depression Screen PHQ 2/9 Scores 03/16/2019 12/30/2018 01/02/2017 04/22/2016 03/26/2016 03/12/2016 12/13/2015  PHQ - 2 Score 0 0 0 0 0 0 0     Fall Risk Fall Risk  03/16/2019 01/02/2017 04/22/2016 03/26/2016 03/12/2016  Falls in the past year? 0 No No No No     Objective:  Michael Horton seemed alert and oriented and he participated appropriately during our telephone visit.  Blood Pressure Weight BMI  BP Readings from Last 3 Encounters:  12/30/18 127/77  01/27/17 128/84  01/02/17 106/75   Wt Readings from Last 3 Encounters:  12/30/18 174 lb 12.8 oz (79.3 kg)  01/27/17 172 lb (78 kg)  01/02/17 174 lb 6.4 oz (79.1 kg)    BMI Readings from Last 1 Encounters:  12/30/18 25.81 kg/m    *Unable to obtain current vital signs, weight, and BMI due to telephone visit type  Hearing/Vision  . John D did not seem to have difficulty with hearing/understanding during the telephone conversation . Reports that he has not had a formal eye exam by an eye care professional within the past year . Reports that he has not had a formal hearing evaluation within the past year *Unable to fully assess hearing and vision during telephone visit type  Cognitive Function: 6CIT Screen 03/16/2019  What Year? 0 points  What month? 0 points  What time? 0 points  Count back from 20 0 points  Months in reverse 2 points  Repeat phrase 0 points  Total Score 2    Normal Cognitive Function Screening: Yes (Normal:0-7, Significant for Dysfunction: >8)  Immunization & Health Maintenance Record Immunization History  Administered Date(s) Administered  . Influenza, High Dose Seasonal PF 12/30/2018  . Influenza,inj,Quad PF,6+ Mos 09/01/2014  . Pneumococcal Conjugate-13 01/02/2017  . Pneumococcal Polysaccharide-23 12/30/2018  . Tdap 01/27/2017    Health Maintenance  Topic Date Due  . INFLUENZA VACCINE  05/28/2019  . COLONOSCOPY  10/09/2021  . TETANUS/TDAP  01/28/2027  . Hepatitis C Screening  Completed  . PNA vac Low Risk Adult  Completed       Assessment  This is a routine wellness examination for [place] Maintenance: Due or Overdue There are no preventive care reminders to display for this patient.  Michael Horton does not need a referral for Community Assistance: Care Management:   no Social Work:    no Prescription Assistance:  no Nutrition/Diabetes Education:  no   Plan:  Personalized Goals Goals Addressed            This Visit's Progress   . DIET - INCREASE WATER INTAKE        Personalized Health Maintenance & Screening Recommendations  Shingles vaccine-will discuss at next visit with PCP since  he has had shingles in the past  Lung Cancer Screening Recommended: will discuss with PCP at next visit (Low Dose CT Chest recommended if Age 69-80 years, 30 pack-year currently smoking OR have quit w/in past 15 years) Hepatitis C Screening recommended: no HIV Screening recommended: no  Advanced Directives: Written information was not prepared per patient's request.  Referrals & Orders No orders of the defined types were placed in this encounter.   Follow-up Plan . Follow-up with Sharion Balloon, FNP as planned    I have personally reviewed and noted the following in the patient's chart:   . Medical and social history . Use of alcohol, tobacco or illicit drugs  . Current medications and supplements . Functional ability and status . Nutritional status . Physical activity . Advanced directives . List of other physicians . Hospitalizations, surgeries, and ER visits in previous 12 months . Vitals . Screenings to include cognitive, depression, and falls . Referrals and appointments  In addition, I have reviewed and discussed with Michael Horton certain preventive protocols, quality metrics, and best practice recommendations. A written personalized care plan for preventive services as well as general preventive health recommendations is available and can be mailed to the patient at his request.      signature  03/16/2019

## 2020-01-02 ENCOUNTER — Encounter: Payer: Medicare HMO | Admitting: Family

## 2020-01-13 ENCOUNTER — Ambulatory Visit (INDEPENDENT_AMBULATORY_CARE_PROVIDER_SITE_OTHER): Payer: Medicare HMO | Admitting: Family

## 2020-01-13 ENCOUNTER — Encounter: Payer: Self-pay | Admitting: Family

## 2020-01-13 ENCOUNTER — Other Ambulatory Visit: Payer: Self-pay

## 2020-01-13 VITALS — BP 126/74 | HR 71 | Temp 98.6°F | Wt 171.0 lb

## 2020-01-13 DIAGNOSIS — F17200 Nicotine dependence, unspecified, uncomplicated: Secondary | ICD-10-CM | POA: Diagnosis not present

## 2020-01-13 DIAGNOSIS — F17210 Nicotine dependence, cigarettes, uncomplicated: Secondary | ICD-10-CM | POA: Diagnosis not present

## 2020-01-13 DIAGNOSIS — F411 Generalized anxiety disorder: Secondary | ICD-10-CM | POA: Diagnosis not present

## 2020-01-13 DIAGNOSIS — Z0000 Encounter for general adult medical examination without abnormal findings: Secondary | ICD-10-CM

## 2020-01-13 DIAGNOSIS — K219 Gastro-esophageal reflux disease without esophagitis: Secondary | ICD-10-CM

## 2020-01-13 DIAGNOSIS — G4700 Insomnia, unspecified: Secondary | ICD-10-CM

## 2020-01-13 DIAGNOSIS — E559 Vitamin D deficiency, unspecified: Secondary | ICD-10-CM | POA: Diagnosis not present

## 2020-01-13 DIAGNOSIS — E781 Pure hyperglyceridemia: Secondary | ICD-10-CM

## 2020-01-13 DIAGNOSIS — D649 Anemia, unspecified: Secondary | ICD-10-CM

## 2020-01-13 DIAGNOSIS — Z0001 Encounter for general adult medical examination with abnormal findings: Secondary | ICD-10-CM

## 2020-01-13 MED ORDER — OMEPRAZOLE 20 MG PO CPDR: 20.0000 mg | DELAYED_RELEASE_CAPSULE | Freq: Every day | ORAL | 4 refills | Status: DC

## 2020-01-13 MED ORDER — CITALOPRAM HYDROBROMIDE 40 MG PO TABS: 40.0000 mg | ORAL_TABLET | Freq: Every day | ORAL | 4 refills | Status: AC

## 2020-01-13 NOTE — Patient Instructions (Signed)

## 2020-01-13 NOTE — Progress Notes (Signed)
Subjective:    Patient ID: Michael Horton, male    DOB: [birth year], 74 y.o.   MRN: [id number]  Chief Complaint  Patient presents with  . Annual Exam    SLEEPING ISSUES BEEN GOING ON FOR AWHILE    PT presents to the office today for CPE.  Gastroesophageal Reflux He complains of belching and heartburn. This is a chronic problem. The current episode started more than 1 year ago. The problem occurs occasionally. Risk factors include smoking/tobacco exposure. He has tried a PPI for the symptoms. The treatment provided moderate relief.  Anxiety Presents for follow-up visit. Symptoms include depressed mood, excessive worry, insomnia, irritability and nervous/anxious behavior. Symptoms occur occasionally. The severity of symptoms is moderate. The quality of sleep is good.    Nicotine Dependence Presents for follow-up visit. Symptoms include insomnia and irritability. His urge triggers include company of smokers. The symptoms have been stable. He smokes < 1/2 a pack of cigarettes per day. Compliance with prior treatments has been good.  Insomnia Primary symptoms: difficulty falling asleep.  The current episode started more than one year.      Review of Systems  Constitutional: Positive for irritability.  Gastrointestinal: Positive for heartburn.  Psychiatric/Behavioral: The patient is nervous/anxious and has insomnia.   All other systems reviewed and are negative.  Family History  Problem Relation Age of Onset  . Dementia Mother   . Heart disease Mother   . Hypertension Father    Social History   Socioeconomic History  . Marital status: Married    Spouse name: Michael Horton  . Number of children: 3  . Years of education: Not on file  . Highest education level: 11th grade  Occupational History  . Occupation: Retired  Tobacco Use  . Smoking status: Current Every Day Smoker    Packs/day: 0.50    Years: 56.00    Pack years: 28.00    Types: Cigarettes  . Smokeless tobacco: Never  Used  Substance and Sexual Activity  . Alcohol use: No    Comment: rare  . Drug use: No  . Sexual activity: Yes    Birth control/protection: None  Other Topics Concern  . Not on file  Social History Narrative  . Not on file   Social Determinants of Health   Financial Resource Strain: Low Risk   . Difficulty of Paying Living Expenses: Not hard at all  Food Insecurity: No Food Insecurity  . Worried About Charity fundraiser in the Last Year: Never true  . Ran Out of Food in the Last Year: Never true  Transportation Needs: No Transportation Needs  . Lack of Transportation (Medical): No  . Lack of Transportation (Non-Medical): No  Physical Activity: Sufficiently Active  . Days of Exercise per Week: 7 days  . Minutes of Exercise per Session: 40 min  Stress: No Stress Concern Present  . Feeling of Stress : Not at all  Social Connections: Slightly Isolated  . Frequency of Communication with Friends and Family: More than three times a week  . Frequency of Social Gatherings with Friends and Family: More than three times a week  . Attends Religious Services: 1 to 4 times per year  . Active Member of Clubs or Organizations: No  . Attends Archivist Meetings: Never  . Marital Status: Married        Objective:   Physical Exam Vitals reviewed.  Constitutional:      General: He is not in acute distress.  Appearance: He is well-developed.  HENT:     Head: Normocephalic.     Right Ear: Tympanic membrane normal.     Left Ear: Tympanic membrane normal.  Eyes:     General:        Right eye: No discharge.        Left eye: No discharge.     Pupils: Pupils are equal, round, and reactive to light.  Neck:     Thyroid: No thyromegaly.  Cardiovascular:     Rate and Rhythm: Normal rate and regular rhythm.     Heart sounds: Normal heart sounds. No murmur.  Pulmonary:     Effort: Pulmonary effort is normal. No respiratory distress.     Breath sounds: Decreased breath sounds  present. No wheezing.  Abdominal:     General: Bowel sounds are normal. There is no distension.     Palpations: Abdomen is soft.     Tenderness: There is no abdominal tenderness.  Musculoskeletal:        General: No tenderness. Normal range of motion.     Cervical back: Normal range of motion and neck supple.  Skin:    General: Skin is warm and dry.     Findings: No erythema or rash.  Neurological:     Mental Status: He is alert and oriented to person, place, and time.     Cranial Nerves: No cranial nerve deficit.     Deep Tendon Reflexes: Reflexes are normal and symmetric.  Psychiatric:        Behavior: Behavior normal.        Thought Content: Thought content normal.        Judgment: Judgment normal.      BP 126/74   Pulse 71   Temp 98.6 F (37 C) (Temporal)   Wt 171 lb (77.6 kg)   SpO2 98%   BMI 25.25 kg/m       Assessment & Plan:  Michael Horton comes in today with chief complaint of Annual Exam (SLEEPING ISSUES BEEN GOING ON FOR AWHILE )   Diagnosis and orders addressed:  1. Annual physical exam  2. Gastroesophageal reflux disease, unspecified whether esophagitis present -Diet discussed- Avoid fried, spicy, citrus foods, caffeine and alcohol -Do not eat 2-3 hours before bedtime -Encouraged small frequent meals -Avoid NSAID's  3. Current smoker Smoking cessation discussed - CT CHEST LUNG CA SCREEN LOW DOSE W/O CM; Future  4. Generalized anxiety disorder Will increase Celexa 40 mg from 20 mg Stress management discussed - citalopram (CELEXA) 40 MG tablet; Take 1 tablet (40 mg total) by mouth daily.  Dispense: 90 tablet; Refill: 4  5. Hypertriglyceridemia  6. Vitamin D deficiency  7. Gastroesophageal reflux disease without esophagitis - omeprazole (PRILOSEC) 20 MG capsule; Take 1 capsule (20 mg total) by mouth daily.  Dispense: 90 capsule; Refill: 4  8. Smokes less than 1 pack a day with greater than 40 pack year history  9. Insomnia, unspecified  type Will use OTC medications    Labs pending Health Maintenance reviewed Diet and exercise encouraged  Follow up plan: 1 year    Evelina Dun, FNP

## 2020-01-16 ENCOUNTER — Telehealth: Payer: Self-pay | Admitting: Family

## 2020-02-13 ENCOUNTER — Telehealth: Payer: Self-pay | Admitting: Family

## 2020-02-13 NOTE — Telephone Encounter (Signed)
Please advise 

## 2020-02-13 NOTE — Telephone Encounter (Signed)
Per the scheduler of CT, " I spoke with Patient regarding the requirement questions for this exam and he states he has only smoked a half a pack per day since he was 17 - so that does not give him and 30 + pack year history - He only has a 28 pack year history - So he will not qualify to have this exam."

## 2020-02-13 NOTE — Telephone Encounter (Signed)
Wife aware and verbalizes understanding per dpr.  °

## 2020-02-15 ENCOUNTER — Other Ambulatory Visit: Payer: Medicare HMO

## 2020-02-15 ENCOUNTER — Other Ambulatory Visit: Payer: Self-pay

## 2020-02-15 DIAGNOSIS — F17210 Nicotine dependence, cigarettes, uncomplicated: Secondary | ICD-10-CM | POA: Diagnosis not present

## 2020-02-15 DIAGNOSIS — F411 Generalized anxiety disorder: Secondary | ICD-10-CM | POA: Diagnosis not present

## 2020-02-15 DIAGNOSIS — E781 Pure hyperglyceridemia: Secondary | ICD-10-CM | POA: Diagnosis not present

## 2020-02-15 DIAGNOSIS — G4700 Insomnia, unspecified: Secondary | ICD-10-CM | POA: Diagnosis not present

## 2020-02-15 DIAGNOSIS — Z0000 Encounter for general adult medical examination without abnormal findings: Secondary | ICD-10-CM | POA: Diagnosis not present

## 2020-02-15 DIAGNOSIS — K219 Gastro-esophageal reflux disease without esophagitis: Secondary | ICD-10-CM | POA: Diagnosis not present

## 2020-02-15 DIAGNOSIS — F17200 Nicotine dependence, unspecified, uncomplicated: Secondary | ICD-10-CM | POA: Diagnosis not present

## 2020-02-15 DIAGNOSIS — E559 Vitamin D deficiency, unspecified: Secondary | ICD-10-CM | POA: Diagnosis not present

## 2020-02-15 DIAGNOSIS — Z125 Encounter for screening for malignant neoplasm of prostate: Secondary | ICD-10-CM | POA: Diagnosis not present

## 2020-02-16 LAB — CMP14+EGFR
ALT: 7 IU/L (ref 0–44)
AST: 11 IU/L (ref 0–40)
Albumin/Globulin Ratio: 1.4 (ref 1.2–2.2)
Albumin: 4.3 g/dL (ref 3.7–4.7)
Alkaline Phosphatase: 120 IU/L — ABNORMAL HIGH (ref 39–117)
BUN/Creatinine Ratio: 14 (ref 10–24)
BUN: 14 mg/dL (ref 8–27)
Bilirubin Total: 0.2 mg/dL (ref 0.0–1.2)
CO2: 25 mmol/L (ref 20–29)
Calcium: 10 mg/dL (ref 8.6–10.2)
Chloride: 96 mmol/L (ref 96–106)
Creatinine, Ser: 0.97 mg/dL (ref 0.76–1.27)
GFR calc Af Amer: 90 mL/min/{1.73_m2} (ref >59)
GFR calc non Af Amer: 78 mL/min/{1.73_m2} (ref >59)
Globulin, Total: 3.1 g/dL (ref 1.5–4.5)
Glucose: 99 mg/dL (ref 65–99)
Potassium: 4.9 mmol/L (ref 3.5–5.2)
Sodium: 134 mmol/L (ref 134–144)
Total Protein: 7.4 g/dL (ref 6.0–8.5)

## 2020-02-16 LAB — CBC WITH DIFFERENTIAL/PLATELET
Basophils Absolute: 0.1 10*3/uL (ref 0.0–0.2)
Basos: 1 %
EOS (ABSOLUTE): 0.2 10*3/uL (ref 0.0–0.4)
Eos: 1 %
Hematocrit: 36.2 % — ABNORMAL LOW (ref 37.5–51.0)
Hemoglobin: 11.6 g/dL — ABNORMAL LOW (ref 13.0–17.7)
Immature Grans (Abs): 0.1 10*3/uL (ref 0.0–0.1)
Immature Granulocytes: 1 %
Lymphocytes Absolute: 2.4 10*3/uL (ref 0.7–3.1)
Lymphs: 19 %
MCH: 24.3 pg — ABNORMAL LOW (ref 26.6–33.0)
MCHC: 32 g/dL (ref 31.5–35.7)
MCV: 76 fL — ABNORMAL LOW (ref 79–97)
Monocytes Absolute: 0.9 10*3/uL (ref 0.1–0.9)
Monocytes: 7 %
Neutrophils Absolute: 9.3 10*3/uL — ABNORMAL HIGH (ref 1.4–7.0)
Neutrophils: 71 %
Platelets: 410 10*3/uL (ref 150–450)
RBC: 4.78 x10E6/uL (ref 4.14–5.80)
RDW: 16.1 % — ABNORMAL HIGH (ref 11.6–15.4)
WBC: 12.8 10*3/uL — ABNORMAL HIGH (ref 3.4–10.8)

## 2020-02-16 LAB — VITAMIN D 25 HYDROXY (VIT D DEFICIENCY, FRACTURES): Vit D, 25-Hydroxy: 30.5 ng/mL (ref 30.0–100.0)

## 2020-02-16 LAB — PSA, TOTAL AND FREE
PSA, Free Pct: 18.9 %
PSA, Free: 0.17 ng/mL
Prostate Specific Ag, Serum: 0.9 ng/mL (ref 0.0–4.0)

## 2020-02-16 LAB — TSH: TSH: 1.74 u[IU]/mL (ref 0.450–4.500)

## 2020-02-17 ENCOUNTER — Other Ambulatory Visit: Payer: Self-pay | Admitting: Family Medicine

## 2020-02-17 DIAGNOSIS — Z0000 Encounter for general adult medical examination without abnormal findings: Secondary | ICD-10-CM

## 2020-02-17 NOTE — Addendum Note (Signed)
Addended by: Ladean Raya on: 02/17/2020 04:27 PM   Modules accepted: Orders

## 2020-02-20 ENCOUNTER — Other Ambulatory Visit: Payer: Self-pay | Admitting: Family

## 2020-02-20 LAB — LIPID PANEL
Chol/HDL Ratio: 4.5 ratio (ref 0.0–5.0)
Cholesterol, Total: 197 mg/dL (ref 100–199)
HDL: 44 mg/dL (ref >39)
LDL Chol Calc (NIH): 125 mg/dL — ABNORMAL HIGH (ref 0–99)
Triglycerides: 159 mg/dL — ABNORMAL HIGH (ref 0–149)
VLDL Cholesterol Cal: 28 mg/dL (ref 5–40)

## 2020-02-20 LAB — SPECIMEN STATUS REPORT

## 2020-02-20 MED ORDER — ATORVASTATIN CALCIUM 20 MG PO TABS: 20.0000 mg | ORAL_TABLET | Freq: Every day | ORAL | 3 refills | Status: DC

## 2020-02-21 ENCOUNTER — Other Ambulatory Visit: Payer: Self-pay | Admitting: *Deleted

## 2020-02-21 MED ORDER — ATORVASTATIN CALCIUM 20 MG PO TABS: 20.0000 mg | ORAL_TABLET | Freq: Every day | ORAL | 3 refills | Status: DC

## 2020-02-23 ENCOUNTER — Other Ambulatory Visit: Payer: Self-pay | Admitting: Family

## 2020-02-23 DIAGNOSIS — K649 Unspecified hemorrhoids: Secondary | ICD-10-CM

## 2020-03-01 ENCOUNTER — Telehealth (INDEPENDENT_AMBULATORY_CARE_PROVIDER_SITE_OTHER): Payer: Medicare HMO | Admitting: Family

## 2020-03-01 ENCOUNTER — Encounter: Payer: Self-pay | Admitting: Family

## 2020-03-01 DIAGNOSIS — R110 Nausea: Secondary | ICD-10-CM

## 2020-03-01 DIAGNOSIS — K279 Peptic ulcer, site unspecified, unspecified as acute or chronic, without hemorrhage or perforation: Secondary | ICD-10-CM

## 2020-03-01 DIAGNOSIS — K219 Gastro-esophageal reflux disease without esophagitis: Secondary | ICD-10-CM | POA: Diagnosis not present

## 2020-03-01 DIAGNOSIS — F17200 Nicotine dependence, unspecified, uncomplicated: Secondary | ICD-10-CM | POA: Diagnosis not present

## 2020-03-01 DIAGNOSIS — R1013 Epigastric pain: Secondary | ICD-10-CM

## 2020-03-01 MED ORDER — ONDANSETRON HCL 4 MG PO TABS: 4.0000 mg | ORAL_TABLET | Freq: Three times a day (TID) | ORAL | 0 refills | Status: DC | PRN

## 2020-03-01 MED ORDER — OMEPRAZOLE 20 MG PO CPDR: 20.0000 mg | DELAYED_RELEASE_CAPSULE | Freq: Two times a day (BID) | ORAL | 1 refills | Status: AC

## 2020-03-01 NOTE — Progress Notes (Signed)
Virtual Visit via telephone Note Due to COVID-19 pandemic this visit was conducted virtually. This visit type was conducted due to national recommendations for restrictions regarding the COVID-19 Pandemic (e.g. social distancing, sheltering in place) in an effort to limit this patient's exposure and mitigate transmission in our community. All issues noted in this document were discussed and addressed.  A physical exam was not performed with this format.  I connected with Michael Horton on 03/01/20 at 11:20 AM by telephone and verified that I am speaking with the correct person using two identifiers. Michael Horton is currently located at home and wife is currently with him during visit. The provider, Evelina Dun, FNP is located in their office at time of visit.  I discussed the limitations, risks, security and privacy concerns of performing an evaluation and management service by telephone and the availability of in person appointments. I also discussed with the patient that there may be a patient responsible charge related to this service. The patient expressed understanding and agreed to proceed.   History and Present Illness:  Pt calls the office today with wife with mild  abdominal pain, nausea, and weight loss of 10 lbs over the last 3 weeks. He reports his pain starts after he eats and improves when his stomach is empty. He reports he has had a stomach ulcer in the past and it feels similar. He does admit to taking Motrin several times a day.  Abdominal Pain This is a new problem. The current episode started 1 to 4 weeks ago. The onset quality is gradual. The problem occurs constantly (constant after eating). The problem has been unchanged. The pain is located in the epigastric region. The pain is at a severity of 4/10. The quality of the pain is sharp. The abdominal pain radiates to the LUQ and LLQ. Associated symptoms include belching, nausea and weight loss (10 lbs ). Pertinent negatives  include no constipation, diarrhea, hematochezia, hematuria or vomiting. The pain is aggravated by eating. The pain is relieved by nothing. He has tried proton pump inhibitors for the symptoms. The treatment provided mild relief.      Review of Systems  Constitutional: Positive for weight loss (10 lbs ).  Gastrointestinal: Positive for abdominal pain and nausea. Negative for constipation, diarrhea, hematochezia and vomiting.  Genitourinary: Negative for hematuria.     Observations/Objective: No SOB or distress noted  Assessment and Plan: Michael Horton comes in today with chief complaint of No chief complaint on file.   Diagnosis and orders addressed:  1. Gastroesophageal reflux disease, unspecified whether esophagitis present - CBC with Differential/Platelet; Future - H Pylori, IGM, IGG, IGA AB; Future - omeprazole (PRILOSEC) 20 MG capsule; Take 1 capsule (20 mg total) by mouth 2 (two) times daily before a meal.  Dispense: 180 capsule; Refill: 1  2. Current smoker - CBC with Differential/Platelet; Future  3. Nausea - CBC with Differential/Platelet; Future - H Pylori, IGM, IGG, IGA AB; Future - ondansetron (ZOFRAN) 4 MG tablet; Take 1 tablet (4 mg total) by mouth every 8 (eight) hours as needed for nausea or vomiting.  Dispense: 20 tablet; Refill: 0 - omeprazole (PRILOSEC) 20 MG capsule; Take 1 capsule (20 mg total) by mouth 2 (two) times daily before a meal.  Dispense: 180 capsule; Refill: 1  4. Epigastric pain - CBC with Differential/Platelet; Future - H Pylori, IGM, IGG, IGA AB; Future - omeprazole (PRILOSEC) 20 MG capsule; Take 1 capsule (20 mg total) by mouth 2 (two)  times daily before a meal.  Dispense: 180 capsule; Refill: 1  5. PUD (peptic ulcer disease) - omeprazole (PRILOSEC) 20 MG capsule; Take 1 capsule (20 mg total) by mouth 2 (two) times daily before a meal.  Dispense: 180 capsule; Refill: 1  Will do lab work to rule out H pylori Zofran as needed for nausea and  will increase PPI to BID from daily Avoid NSAID's Worrisome that he has had 10 lb weight loss and smoker, if labs normal will need referral [place] Call if symptoms worsen or do not improve     I discussed the assessment and treatment plan with the patient. The patient was provided an opportunity to ask questions and all were answered. The patient agreed with the plan and demonstrated an understanding of the instructions.   The patient was advised to call back or seek an in-person evaluation if the symptoms worsen or if the condition fails to improve as anticipated.  The above assessment and management plan was discussed with the patient. The patient verbalized understanding of and has agreed to the management plan. Patient is aware to call the clinic if symptoms persist or worsen. Patient is aware when to return to the clinic for a follow-up visit. Patient educated on when it is appropriate to go to the emergency department.   Time call ended:  11:45 AM  I provided 25 minutes of non-face-to-face time during this encounter.    Evelina Dun, FNP

## 2020-03-02 ENCOUNTER — Other Ambulatory Visit: Payer: Medicare HMO

## 2020-03-02 ENCOUNTER — Other Ambulatory Visit: Payer: Self-pay

## 2020-03-02 DIAGNOSIS — R110 Nausea: Secondary | ICD-10-CM

## 2020-03-02 DIAGNOSIS — K219 Gastro-esophageal reflux disease without esophagitis: Secondary | ICD-10-CM

## 2020-03-02 DIAGNOSIS — F17200 Nicotine dependence, unspecified, uncomplicated: Secondary | ICD-10-CM | POA: Diagnosis not present

## 2020-03-02 DIAGNOSIS — R1013 Epigastric pain: Secondary | ICD-10-CM | POA: Diagnosis not present

## 2020-03-05 LAB — CBC WITH DIFFERENTIAL/PLATELET
Basophils Absolute: 0.1 10*3/uL (ref 0.0–0.2)
Basos: 0 %
EOS (ABSOLUTE): 0.1 10*3/uL (ref 0.0–0.4)
Eos: 1 %
Hematocrit: 37.5 % (ref 37.5–51.0)
Hemoglobin: 12 g/dL — ABNORMAL LOW (ref 13.0–17.7)
Immature Grans (Abs): 0.1 10*3/uL (ref 0.0–0.1)
Immature Granulocytes: 1 %
Lymphocytes Absolute: 2.4 10*3/uL (ref 0.7–3.1)
Lymphs: 17 %
MCH: 24.5 pg — ABNORMAL LOW (ref 26.6–33.0)
MCHC: 32 g/dL (ref 31.5–35.7)
MCV: 77 fL — ABNORMAL LOW (ref 79–97)
Monocytes Absolute: 1 10*3/uL — ABNORMAL HIGH (ref 0.1–0.9)
Monocytes: 7 %
Neutrophils Absolute: 10.3 10*3/uL — ABNORMAL HIGH (ref 1.4–7.0)
Neutrophils: 74 %
Platelets: 416 10*3/uL (ref 150–450)
RBC: 4.9 x10E6/uL (ref 4.14–5.80)
RDW: 16.3 % — ABNORMAL HIGH (ref 11.6–15.4)
WBC: 13.9 10*3/uL — ABNORMAL HIGH (ref 3.4–10.8)

## 2020-03-05 LAB — H PYLORI, IGM, IGG, IGA AB
H pylori, IgM Abs: <9 units (ref 0.0–8.9)
H. pylori, IgA Abs: <9 units (ref 0.0–8.9)
H. pylori, IgG AbS: 0.21 Index Value (ref 0.00–0.79)

## 2020-03-06 ENCOUNTER — Other Ambulatory Visit: Payer: Self-pay | Admitting: Family

## 2020-03-06 DIAGNOSIS — D72829 Elevated white blood cell count, unspecified: Secondary | ICD-10-CM

## 2020-03-06 DIAGNOSIS — R1012 Left upper quadrant pain: Secondary | ICD-10-CM

## 2020-03-12 ENCOUNTER — Telehealth: Payer: Self-pay | Admitting: Family

## 2020-03-12 NOTE — Telephone Encounter (Signed)
  REFERRAL REQUEST Telephone Note 03/12/2020  What type of referral do you need? Ct scan/ pt has appt 05/26 but wife is concerned because he is losing weight and has a lot of stomach pain.  Have you been seen at our office for this problem? yes (Advise that they may need an appointment with their PCP before a referral can be done)  Is there a particular doctor or location that you prefer? [facility] [place]  Patient notified that referrals can take up to a week or longer to process. If they haven't heard anything within a week they should call back and speak with the referral department.

## 2020-03-13 NOTE — Telephone Encounter (Signed)
Spoke with Michael Horton from [facility] - No availability at [facility] or [facility] before 5/26 - Patient aware.

## 2020-03-15 ENCOUNTER — Encounter: Payer: Self-pay | Admitting: General Surgery

## 2020-03-15 ENCOUNTER — Ambulatory Visit: Payer: Medicare HMO | Admitting: General Surgery

## 2020-03-15 ENCOUNTER — Other Ambulatory Visit: Payer: Self-pay

## 2020-03-15 VITALS — BP 105/66 | HR 80 | Temp 98.9°F | Ht 70.0 in | Wt 161.0 lb

## 2020-03-15 DIAGNOSIS — K641 Second degree hemorrhoids: Secondary | ICD-10-CM | POA: Insufficient documentation

## 2020-03-15 DIAGNOSIS — K6289 Other specified diseases of anus and rectum: Secondary | ICD-10-CM | POA: Diagnosis not present

## 2020-03-15 DIAGNOSIS — K625 Hemorrhage of anus and rectum: Secondary | ICD-10-CM | POA: Diagnosis not present

## 2020-03-15 NOTE — Progress Notes (Signed)
[facility] History and Physical  Reason for Referral: Hemorrhoids? Rectal bleeding  Referring Physician: Sharion Balloon, FNP   Chief Complaint    New Patient (Initial Visit)      Michael Horton is a 74 y.o. male.  HPI: [name] is a very sweet 74 yo who comes in with his wife today. He has had hemorrhoids for about 30 years he reports with some bleeding per rectum periodically. He never has any discomfort, hygiene issues, itching or drainage. He is worried that th amount of bleeding is too much and wants to discuss his options for the hemorrhoids.  His Hgb has been stable over several years ranging from 11.6-12.7.  He had a colonoscopy over 10 years ago and this was negative. This was done in [place].  He has had some epigastric pain and nausea with food recently, and has a history of peptic ulcer disease. He has lost about 10 lbs in the last three weeks.  He does have some constipation at times.  He has used some hemorrhoid creams but this has not helped.  A few weeks ago he had a lot of bleeding.    Past Medical History:  Diagnosis Date  . Anxiety   . GERD (gastroesophageal reflux disease)   . Hyperlipidemia     Past Surgical History:  Procedure Laterality Date  . ANKLE FRACTURE SURGERY Right 2017  . HERNIA REPAIR    . SPINE SURGERY    . TIBIA FRACTURE SURGERY Right 2017    Family History  Problem Relation Age of Onset  . Dementia Mother   . Heart disease Mother   . Hypertension Father     Social History   Tobacco Use  . Smoking status: Current Every Day Smoker    Packs/day: 0.50    Years: 56.00    Pack years: 28.00    Types: Cigarettes  . Smokeless tobacco: Never Used  Substance Use Topics  . Alcohol use: No    Comment: rare  . Drug use: No    Medications: I have reviewed the patient's current medications. Allergies as of 03/15/2020   No Known Allergies     Medication List       Accurate as of Mar 15, 2020  3:04 PM. If you have any  questions, ask your nurse or doctor.        STOP taking these medications   atorvastatin 20 MG tablet Commonly known as: LIPITOR Stopped by: Virl Cagey, MD     TAKE these medications   citalopram 40 MG tablet Commonly known as: CeleXA Take 1 tablet (40 mg total) by mouth daily.   MULTI VITAMIN DAILY PO Take by mouth.   omeprazole 20 MG capsule Commonly known as: PRILOSEC Take 1 capsule (20 mg total) by mouth 2 (two) times daily before a meal.   ondansetron 4 MG tablet Commonly known as: Zofran Take 1 tablet (4 mg total) by mouth every 8 (eight) hours as needed for nausea or vomiting.        ROS:  A comprehensive review of systems was negative except for: Gastrointestinal: positive for abdominal pain, nausea, vomiting and BRBPR Genitourinary: positive for decreased stream, nocturia and dribbling Musculoskeletal: positive for neck pain and joint pain    Blood pressure 105/66, pulse 80, temperature 98.9 F (37.2 C), height 5\' 10"  (1.778 m), weight 161 lb (73 kg), SpO2 94 %. Physical Exam Vitals reviewed. Exam conducted with a chaperone present.  Constitutional:  Appearance: He is normal weight.  HENT:     Head: Normocephalic.     Nose: Nose normal.     Mouth/Throat:     Mouth: Mucous membranes are moist.  Eyes:     Extraocular Movements: Extraocular movements intact.     Pupils: Pupils are equal, round, and reactive to light.  Cardiovascular:     Rate and Rhythm: Normal rate and regular rhythm.  Pulmonary:     Effort: Pulmonary effort is normal.     Breath sounds: Normal breath sounds.  Abdominal:     General: There is no distension.     Palpations: Abdomen is soft.     Tenderness: There is no abdominal tenderness.  Genitourinary:    Rectum: No mass, anal fissure or external hemorrhoid.     Comments: Nodular papilla type tissue at the anal verge, no verrucous type lesion Musculoskeletal:        General: No swelling. Normal range of motion.    Skin:    General: Skin is warm and dry.  Neurological:     General: No focal deficit present.     Mental Status: He is alert and oriented to person, place, and time.  Psychiatric:        Mood and Affect: Mood normal.        Behavior: Behavior normal.        Thought Content: Thought content normal.        Judgment: Judgment normal.     Results: None   Assessment & Plan:  Michael Horton is a 74 y.o. male with rectal bleeding thought to be from hemorrhoids but he has not had a colonoscopy in over 10 years. He is average risk given no prior polyps and no high risk in family, but still needs a colonoscopy to confirm the bleeding is not from something else. We discussed hemorrhoidectomy and internal hemorrhoids and the pain associated with hemorrhoidectomy. Discussed the minimal bleeding that he is having and no effect on his blood counts, and that it just appears to be more than it truly is when he sees it.   -Referral for colonoscopy for rectal bleeding, weight loss -Will see back in 2 months to discuss hemorrhoids again and hopefully he will have been able to see GI by that time  -Given nocturia and decreased flow likely with BPH, PSA 01/2020 were normal by PCP  -Told family I would prescribe Flomax to see if that helped symptoms and PCP could take over after or refer if needed to Urology   Future Appointments  Date Time Provider [place]  03/21/2020  1:00 PM [facility] 1 [facility] [place] [facility]  03/22/2020  2:30 PM [facility]-ANNUAL WELLNESS VISIT [facility] None  05/17/2020  2:00 PM Virl Cagey, MD [facility] None    All questions were answered to the satisfaction of the patient and family.   Virl Cagey 03/15/2020, 3:04 PM

## 2020-03-15 NOTE — Patient Instructions (Signed)
Colonoscopy for bleeding. See back to look at bottom and discussion options.   Hemorrhoids Hemorrhoids are swollen veins in and around the rectum or anus. There are two types of hemorrhoids:  Internal hemorrhoids. These occur in the veins that are just inside the rectum. They may poke through to the outside and become irritated and painful.  External hemorrhoids. These occur in the veins that are outside the anus and can be felt as a painful swelling or hard lump near the anus. Most hemorrhoids do not cause serious problems, and they can be managed with home treatments such as diet and lifestyle changes. If home treatments do not help the symptoms, procedures can be done to shrink or remove the hemorrhoids. What are the causes? This condition is caused by increased pressure in the anal area. This pressure may result from various things, including:  Constipation.  Straining to have a bowel movement.  Diarrhea.  Pregnancy.  Obesity.  Sitting for long periods of time.  Heavy lifting or other activity that causes you to strain.  Anal sex.  Riding a bike for a long period of time. What are the signs or symptoms? Symptoms of this condition include:  Pain.  Anal itching or irritation.  Rectal bleeding.  Leakage of stool (feces).  Anal swelling.  One or more lumps around the anus. How is this diagnosed? This condition can often be diagnosed through a visual exam. Other exams or tests may also be done, such as:  An exam that involves feeling the rectal area with a gloved hand (digital rectal exam).  An exam of the anal canal that is done using a small tube (anoscope).  A blood test, if you have lost a significant amount of blood.  A test to look inside the colon using a flexible tube with a camera on the end (sigmoidoscopy or colonoscopy). How is this treated? This condition can usually be treated at home. However, various procedures may be done if dietary changes,  lifestyle changes, and other home treatments do not help your symptoms. These procedures can help make the hemorrhoids smaller or remove them completely. Some of these procedures involve surgery, and others do not. Common procedures include:  Rubber band ligation. Rubber bands are placed at the base of the hemorrhoids to cut off their blood supply.  Sclerotherapy. Medicine is injected into the hemorrhoids to shrink them.  Infrared coagulation. A type of light energy is used to get rid of the hemorrhoids.  Hemorrhoidectomy surgery. The hemorrhoids are surgically removed, and the veins that supply them are tied off.  Stapled hemorrhoidopexy surgery. The surgeon staples the base of the hemorrhoid to the rectal wall. Follow these instructions at home: Eating and drinking   Eat foods that have a lot of fiber in them, such as whole grains, beans, nuts, fruits, and vegetables.  Ask your health care provider about taking products that have added fiber (fiber supplements).  Reduce the amount of fat in your diet. You can do this by eating low-fat dairy products, eating less red meat, and avoiding processed foods.  Drink enough fluid to keep your urine pale yellow. Managing pain and swelling   Take warm sitz baths for 20 minutes, 3-4 times a day to ease pain and discomfort. You may do this in a bathtub or using a portable sitz bath that fits over the toilet.  If directed, apply ice to the affected area. Using ice packs between sitz baths may be helpful. ? Put ice in a  plastic bag. ? Place a towel between your skin and the bag. ? Leave the ice on for 20 minutes, 2-3 times a day. General instructions  Take over-the-counter and prescription medicines only as told by your health care provider.  Use medicated creams or suppositories as told.  Get regular exercise. Ask your health care provider how much and what kind of exercise is best for you. In general, you should do moderate exercise for at  least 30 minutes on most days of the week (150 minutes each week). This can include activities such as walking, biking, or yoga.  Go to the bathroom when you have the urge to have a bowel movement. Do not wait.  Avoid straining to have bowel movements.  Keep the anal area dry and clean. Use wet toilet paper or moist towelettes after a bowel movement.  Do not sit on the toilet for long periods of time. This increases blood pooling and pain.  Keep all follow-up visits as told by your health care provider. This is important. Contact a health care provider if you have:  Increasing pain and swelling that are not controlled by treatment or medicine.  Difficulty having a bowel movement, or you are unable to have a bowel movement.  Pain or inflammation outside the area of the hemorrhoids. Get help right away if you have:  Uncontrolled bleeding from your rectum. Summary  Hemorrhoids are swollen veins in and around the rectum or anus.  Most hemorrhoids can be managed with home treatments such as diet and lifestyle changes.  Taking warm sitz baths can help ease pain and discomfort.  In severe cases, procedures or surgery can be done to shrink or remove the hemorrhoids. This information is not intended to replace advice given to you by your health care provider. Make sure you discuss any questions you have with your health care provider. Document Revised: 03/11/2019 Document Reviewed: 03/04/2018 Elsevier Patient Education  [place].  Surgical Procedures for Hemorrhoids Surgical procedures can be used to treat hemorrhoids. Hemorrhoids are swollen veins that are inside the rectum (internal hemorrhoids) or around the anus (external hemorrhoids). They are caused by increased pressure in the anal area. This pressure may result from straining to have a bowel movement (constipation), diarrhea, pregnancy, obesity, or sitting for long periods of time. Hemorrhoids can cause symptoms such as  pain and bleeding. Surgery may be needed if diet changes, lifestyle changes, and other treatments do not help your symptoms. Common surgical methods that may be used include:  Closed hemorrhoidectomy. The hemorrhoids are surgically removed, and the incisions are closed with stitches (sutures).  Open hemorrhoidectomy. The hemorrhoids are surgically removed, but the incisions are allowed to heal without sutures.  Stapled hemorrhoidectomy. The hemorrhoids are partially removed, and the incisions are closed with staples. Tell a health care provider about:  Any allergies you have.  All medicines you are taking, including vitamins, herbs, eye drops, creams, and over-the-counter medicines.  Any problems you or family members have had with anesthetic medicines.  Any blood disorders you have.  Any surgeries you have had.  Any medical conditions you have.  Whether you are pregnant or may be pregnant. What are the risks? Generally, this is a safe procedure. However, problems may occur, including:  Infection.  Bleeding.  Allergic reactions to medicines.  Damage to other structures or organs.  Pain.  Constipation.  Difficulty passing urine.  Narrowing of the anal canal (stenosis).  Difficulty controlling bowel movements (incontinence).  Recurring hemorrhoids.  A new passage (fistula) that forms between the anus or rectum and another area. What happens before the procedure? Medicines  Ask your health care provider about: ? Changing or stopping your regular medicines. This is especially important if you are taking diabetes medicines or blood thinners. ? Taking medicines such as aspirin and ibuprofen. These medicines can thin your blood. Do not take these medicines unless your health care provider tells you to take them. ? Taking over-the-counter medicines, vitamins, herbs, and supplements.   General instructions  You may need to have a procedure to examine the inside of your  colon with a scope (colonoscopy). Your health care provider may do this to make sure that there are no other causes for your bleeding or pain.  You may be instructed to take a laxative and an enema to clean out your colon before surgery (bowel prep). Carefully follow instructions from your health care provider about bowel prep.  Plan to have someone take you home from the hospital or clinic.  Plan to have a responsible adult care for you for at least 24 hours after you leave the hospital or clinic. This is important.  Ask your health care provider: ? How your surgery site will be marked. ? What steps will be taken to help prevent infection. These may include:  Washing skin with a germ-killing soap.  Taking antibiotic medicine. What happens during the procedure?  An IV will be inserted into one of your veins.  You will be given one or more of the following: ? A medicine to help you relax (sedative). ? A medicine to numb the area (local anesthetic). ? A medicine to make you fall asleep (general anesthetic). ? A medicine that is injected into an area of your body to numb everything below the injection site (regional anesthetic).  A lubricating jelly may be placed into your rectum.  Your surgeon will insert a short scope (anoscope) into your rectum to examine the hemorrhoids.  One of the following surgical methods will be used to remove the hemorrhoids: ? Closed hemorrhoidectomy.  Your surgeon will use surgical instruments to open the tissue around the hemorrhoids.  The veins that supply the hemorrhoids will be tied off with a suture.  The hemorrhoids will be removed.  The tissue that surrounds the hemorrhoids will be closed with sutures that your body can absorb (absorbable sutures). ? Open hemorrhoidectomy.  The hemorrhoids will be removed with surgical instruments.  The incisions will be left open to heal without sutures. ? Stapled hemorrhoidectomy.  Your surgeon will use  a circular stapling device to partially remove the hemorrhoids.  The device will be inserted into your anus. It will remove a circular ring of tissue that includes hemorrhoid tissue and some tissue above the hemorrhoids.  The staples in the device will close the edges of the tissue. This will cut off the blood supply to any remaining hemorrhoids and pull the tissue back into place. Each of these procedures may vary among health care providers and hospitals. What happens after the procedure?  Your blood pressure, heart rate, breathing rate, and blood oxygen level may be monitored until you leave the hospital or clinic.  You will be given pain medicine as needed.  Do not drive for 24 hours if you were given a sedative during your procedure. Summary  Surgery may be needed for hemorrhoids if diet changes, lifestyle changes, and other treatments do not help your symptoms.  There are three common methods of surgery  that are used to treat hemorrhoids.  Follow instructions from your health care provider about taking medicines and about eating and drinking before the procedure.  You may be instructed to take a laxative and an enema to clean out your colon before surgery (bowel prep). This information is not intended to replace advice given to you by your health care provider. Make sure you discuss any questions you have with your health care provider. Document Revised: 03/30/2019 Document Reviewed: 08/31/2018 Elsevier Patient Education  [place].

## 2020-03-16 ENCOUNTER — Encounter: Payer: Self-pay | Admitting: General Surgery

## 2020-03-16 MED ORDER — TAMSULOSIN HCL 0.4 MG PO CAPS: 0.4000 mg | ORAL_CAPSULE | Freq: Every day | ORAL | 1 refills | Status: DC

## 2020-03-19 ENCOUNTER — Ambulatory Visit: Payer: Medicare HMO | Admitting: Gastroenterology

## 2020-03-19 ENCOUNTER — Other Ambulatory Visit: Payer: Self-pay

## 2020-03-19 ENCOUNTER — Telehealth: Payer: Self-pay

## 2020-03-19 ENCOUNTER — Encounter: Payer: Self-pay | Admitting: Internal Medicine

## 2020-03-19 ENCOUNTER — Encounter: Payer: Self-pay | Admitting: Gastroenterology

## 2020-03-19 VITALS — BP 107/64 | HR 91 | Temp 97.1°F | Ht 70.0 in | Wt 164.6 lb

## 2020-03-19 DIAGNOSIS — D649 Anemia, unspecified: Secondary | ICD-10-CM | POA: Diagnosis not present

## 2020-03-19 DIAGNOSIS — K5900 Constipation, unspecified: Secondary | ICD-10-CM | POA: Diagnosis not present

## 2020-03-19 DIAGNOSIS — R109 Unspecified abdominal pain: Secondary | ICD-10-CM | POA: Insufficient documentation

## 2020-03-19 DIAGNOSIS — R110 Nausea: Secondary | ICD-10-CM | POA: Diagnosis not present

## 2020-03-19 DIAGNOSIS — K625 Hemorrhage of anus and rectum: Secondary | ICD-10-CM

## 2020-03-19 DIAGNOSIS — D509 Iron deficiency anemia, unspecified: Secondary | ICD-10-CM | POA: Insufficient documentation

## 2020-03-19 DIAGNOSIS — R1010 Upper abdominal pain, unspecified: Secondary | ICD-10-CM

## 2020-03-19 NOTE — Telephone Encounter (Signed)
VM received 03/19/20. Pt  Was seen today and labs were entered for [facility]. They would prefer [facility] unless there is a reason pt shouldn't go there. [phone]  Spoke with pts spouse. Lab orders have been changed to [place] per pts request.

## 2020-03-19 NOTE — Progress Notes (Signed)
Referring Provider: Virl Cagey, MD Primary Care Physician:  Sharion Balloon, FNP Primary Gastroenterologist:  Dr. Gala Romney  Chief Complaint  Patient presents with  . Rectal Bleeding    from hemorrhoids  . Weight Loss    gaining weight back but had lost 12 lbs.    HPI:   Michael Horton is a 74 y.o. male presenting today at the request of Dr. Constance Haw for rectal bleeding, screening colonoscopy, and weight loss.  Patient had a virtual visit with PCP on 03/01/2020.  He reported mild epigastric abdominal pain, nausea, and weight loss of 10 pounds over the last 3 weeks.  Abdominal pain started after eating improved when stomach was empty.  Reported history of stomach ulcer and felt abdominal pain was similar.  Admits to taking Motrin several times a day.  He was started on omeprazole 20 mg twice daily, Zofran 4 mg every 8 hours as needed, and labs ordered for CBC and H. pylori.  H. pylori serologies were negative.  CBC with WBC 13.9 (H), hemoglobin 12.0 (L), MCV 77 (L), MCH 24.5 (L).  Follow-up CT A/P with contrast has been ordered and is currently scheduled for 03/21/2020.  Notably, he has had mild anemia since April 2018 and overall appears fairly stable.  Office visit with Dr. Constance Haw 03/15/2020.  Patient reported long history of hemorrhoids x30 years with intermittent bleeding.  Was worried bleeding was too much and wanted to discuss options for hemorrhoids.  He reported colonoscopy over 10 years ago that was negative. Constipation at times.  He has used some hemorrhoid creams but this has not helped.  A few weeks ago he had a lot of bleeding. Dr. Constance Haw recommended colonoscopy to evaluate rectal bleeding and ensure bleeding is not from something other than hemorrhoids and follow-up in 2 months.   Today:  Last colonoscopy in [place] about 10 years ago. Doesn't remember having any polyps. Has been having intermittent rectal bleeding for 35 years. When he strains or gains weight, he will have  bleeding. Occurs about once a month. Blood will drip into toilet water. No black stools. Hemorrhoids will prolapse at times. Stools are hard/firm. Straining at time. BMs every morning. Hard stools x 2 months. Stools used to be more loose and 2 BMs daily. Doesn't drink much water. Not a lot of fruit. Likes vegetables. Hasn't taken anything for hard stools. If eating high fiber cereal, stools will loosen up. Cereal is his desert.   Has been having intermittent upper abdominal pain and nausea for the last 1-2 months. No heartburn symptoms. Has been on prilosec daily for years. History of peptic ulcer in his 63s. That is what upper abdominal pain felt like. Pain is improving. Most of the time it occurs in the evening, 3-4 times a week, after he lays down for bed. Last a couple hours. Feels somewhat like gas pain. Eating had been making it worse. Nausea had been all day every day. No vomiting. Nausea significantly improved with Zofran. Eating well. Has gained 6 lbs back according to his scales at home. Taking Zofran q8 hours around the clock.    No dysphagia. No early satiety.  Rare alcohol.  Was taking ibuprofen, sometimes 4 a day for several months.   History of insomnia.  Does not sleep well.  States he will be up at 3 am still awake.    Past Medical History:  Diagnosis Date  . Anxiety   . Depression   . GERD (gastroesophageal reflux disease)   .  Hyperlipidemia     Past Surgical History:  Procedure Laterality Date  . ANKLE FRACTURE SURGERY Right 2017  . COLONOSCOPY     Per patient, this was in [place], no polyps  . HERNIA REPAIR    . SPINE SURGERY    . TIBIA FRACTURE SURGERY Right 2017    Current Outpatient Medications  Medication Sig Dispense Refill  . citalopram (CELEXA) 40 MG tablet Take 1 tablet (40 mg total) by mouth daily. 90 tablet 4  . Multiple Vitamin (MULTI VITAMIN DAILY PO) Take by mouth.    Marland Kitchen omeprazole (PRILOSEC) 20 MG capsule Take 1 capsule (20 mg total) by mouth 2 (two)  times daily before a meal. (Patient taking differently: Take 20 mg by mouth daily. ) 180 capsule 1  . ondansetron (ZOFRAN) 4 MG tablet Take 1 tablet (4 mg total) by mouth every 8 (eight) hours as needed for nausea or vomiting. 20 tablet 0  . tamsulosin (FLOMAX) 0.4 MG CAPS capsule Take 1 capsule (0.4 mg total) by mouth daily. 30 capsule 1   No current facility-administered medications for this visit.    Allergies as of 03/19/2020  . (No Known Allergies)    Family History  Problem Relation Age of Onset  . Dementia Mother   . Heart disease Mother   . Hypertension Father   . Lung cancer Paternal Uncle   . Throat cancer Paternal Grandfather   . Colon cancer Neg Hx   . Esophageal cancer Neg Hx   . Stomach cancer Neg Hx     Social History   Socioeconomic History  . Marital status: Married    Spouse name: [name]  . Number of children: 3  . Years of education: Not on file  . Highest education level: 11th grade  Occupational History  . Occupation: Retired  Tobacco Use  . Smoking status: Current Every Day Smoker    Packs/day: 0.50    Years: 56.00    Pack years: 28.00    Types: Cigarettes  . Smokeless tobacco: Never Used  Substance and Sexual Activity  . Alcohol use: No    Comment: rare  . Drug use: No  . Sexual activity: Yes    Birth control/protection: None  Other Topics Concern  . Not on file  Social History Narrative  . Not on file   Social Determinants of Health   Financial Resource Strain:   . Difficulty of Paying Living Expenses:   Food Insecurity:   . Worried About Charity fundraiser in the Last Year:   . Arboriculturist in the Last Year:   Transportation Needs:   . Film/video editor (Medical):   Marland Kitchen Lack of Transportation (Non-Medical):   Physical Activity:   . Days of Exercise per Week:   . Minutes of Exercise per Session:   Stress:   . Feeling of Stress :   Social Connections:   . Frequency of Communication with Friends and Family:   .  Frequency of Social Gatherings with Friends and Family:   . Attends Religious Services:   . Active Member of Clubs or Organizations:   . Attends Archivist Meetings:   Marland Kitchen Marital Status:   Intimate Partner Violence:   . Fear of Current or Ex-Partner:   . Emotionally Abused:   Marland Kitchen Physically Abused:   . Sexually Abused:     Review of Systems: Gen: Denies any fever, chills, lightheadedness, dizziness, presyncope, syncope. CV: Denies chest pain or heart palpitations. Resp: Admits  to occasional shortness of breath with exertion, none at rest.  Occasional cough. GI: See HPI GU : No dysuria or hematuria. Admits to frequency.  MS: Admits joint pain.  Derm: Denies rash Psych: Admits to depression/anxiety.  Heme: See HPI  Physical Exam: BP 107/64   Pulse 91   Temp (!) 97.1 F (36.2 C) (Oral)   Ht 5\' 10"  (1.778 m)   Wt 164 lb 9.6 oz (74.7 kg)   BMI 23.62 kg/m  General:   Alert and oriented. Pleasant and cooperative. Well-nourished and well-developed.  Head:  Normocephalic and atraumatic. Eyes:  Without icterus, sclera clear and conjunctiva pink.  Ears:  Normal auditory acuity. Lungs:  Clear to auscultation bilaterally. No wheezes, rales, or rhonchi. No distress.  Heart:  S1, S2 present without murmurs appreciated.  Abdomen:  +BS, soft, non-tender and non-distended. No HSM noted. No guarding or rebound. No masses appreciated.  Rectal:  Deferred  Msk:  Symmetrical without gross deformities. Normal posture. Extremities:  Without edema. Neurologic:  Alert and  oriented x4;  grossly normal neurologically. Skin:  Intact without significant lesions or rashes. Psych: Normal mood and affect.

## 2020-03-19 NOTE — Patient Instructions (Addendum)
We will get you scheduled for a colonoscopy and upper endoscopy in the near future with Dr. Gala Romney.  Please have labs completed at [facility] lab.  I am checking your iron.  Please increase omeprazole to 20 mg twice daily 30 minutes before breakfast and 30 minutes before dinner.  It looks like your primary care had sent in a prescription for this.  If you need a new prescription, please let me know.  Continue taking Zofran as needed for nausea.  Continue to avoid all NSAIDs including ibuprofen, Aleve, Advil, and Goody powders.  Add Benefiber or Metamucil daily.  These are fiber supplements that will help with bowel regularity.  May also add MiraLAX 1 capful (17 g) in 8 ounces of water daily to help get your stools moving better.  If you develop frequent loose stools, decrease frequency of MiraLAX.  We will see you back after your procedures.  Call with questions or concerns prior.  Aliene Altes, PA-C [facility]

## 2020-03-20 ENCOUNTER — Encounter: Payer: Self-pay | Admitting: Gastroenterology

## 2020-03-20 NOTE — Assessment & Plan Note (Addendum)
Chronic mild anemia with microcytic indices since 2018 with hemoglobin typically in the 12 range.  Most recently in May 2021, hemoglobin 12.0.  Kidney function within normal limits.  No iron panel on file.  Notably, patient does have intermittent bright red blood per rectum in the setting of known hemorrhoids for the last 30+ years as discussed above.  More recently with epigastric pain and nausea without vomiting along with 13 pound weight loss several weeks ago.  This is in the setting of daily NSAID use.  Nausea significantly improved with Zofran and patient is now gaining weight.  Epigastric pain continues to be intermittent and typically occurring when laying down at night lasting a couple of hours.  Denies melena.  Has been on Prilosec 20 mg daily for years.  Per patient, history of PUD in his 46s.  Last colonoscopy about 10 years ago in [place], denies polyps.    Patient needs colonoscopy to evaluate for other etiologies of rectal bleeding including colon polyps or malignancy.  He also needs EGD to help evaluate ongoing epigastric discomfort, concern for PUD versus gastritis, esophagitis, duodenitis, or H pylori.  Procedures also help evaluate for mild anemia.   Proceed with TCS + EGD with propofol with Dr. Gala Romney in the near future. The risks, benefits, and alternatives have been discussed in detail with patient. They have stated understanding and desire to proceed.  Propofol due to patient reporting significant insomnia as well as anxiety/depression on Celexa. Check iron panel. Increase omeprazole to 20 mg twice daily. Avoid all NSAIDs. Follow-up after procedures.

## 2020-03-20 NOTE — Assessment & Plan Note (Signed)
Addressed under abdominal pain.  

## 2020-03-20 NOTE — Assessment & Plan Note (Addendum)
74 year old male who had new onset of upper abdominal pain and nausea without vomiting with associated 13 pound weight loss several weeks ago.  Patient reports history of PUD in his 70s with similar symptoms.  Has been on Prilosec 20 mg chronically which controls GERD well.  Notably, he had been taking ibuprofen daily for the last several months.  Denies melena.  Chronic intermittent bright red blood per rectum in the setting of known hemorrhoids as discussed above.  Saw PCP for the symptoms in early May and was started on Zofran every 8 hours.  Also advised to increase omeprazole to twice daily but he continues with once daily dosing.  With taking Zofran around-the-clock, nausea has significantly improved and he has started gaining weight back.  Still continues with intermittent epigastric discomfort, typically at night after laying down that may last for couple of hours.  Abdominal exam is benign today.  Recent labs with hemoglobin 12.0 (L) with microcytic indices.  Mild anemia present since 2018 and is overall stable.  No iron panel on file.  No family history of esophageal or stomach cancer. PCP has ordered CT A/P with contrast which is scheduled for 5/26.  Considering history of PUD, daily NSAID use, mild microcytic anemia, and ongoing epigastric pain, I am concerned for PUD.  May also have H. pylori, gastritis, esophagitis, or duodenitis.  Cannot rule out malignancy.   Increase omeprazole to 20 mg twice daily. Continue Zofran as needed. Avoid all NSAIDs. Check iron panel. Proceed with EGD with propofol with Dr. Gala Romney in the near future. The risks, benefits, and alternatives have been discussed in detail with patient. They have stated understanding and desire to proceed.  Propofol due to patient reporting significant insomnia as well as anxiety/depression on Celexa. Follow-up after procedures.

## 2020-03-20 NOTE — Assessment & Plan Note (Addendum)
74 year old male with 30+ year history of intermittent rectal bleeding in the setting of hemorrhoids that prolapse at times.  Rectal bleeding typically occurs about once a month with bright red blood on toilet tissue and dripping into toilet water.  Admits to hard stools x2 months.  Improves with high-fiber cereal. Recently saw Dr. Constance Haw with general surgery to discuss hemorrhoid options and she requested patient have updated colonoscopy to ensure rectal bleeding is not coming from any other source.  Last colonoscopy about 10 years ago in [place].  Denies any history of colon polyps.  No family history of colon cancer.  Notably, patient did have 13 pound weight loss in the setting of epigastric pain with nausea recently as discussed below.  Recent labs on file with hemoglobin 12.0 (L) with microcytic indices.  He has had mild anemia since April 2018 with hemoglobin typically in the 12 range.  No iron panel on file.  Suspect rectal bleeding is likely secondary to hemorrhoids; however, cannot rule out colon polyps or malignancy.  Proceed with TCS with propofol with Dr. Gala Romney in the near future.  He will also have EGD at the same time to evaluate epigastric pain/nausea as discussed below. The risks, benefits, and alternatives have been discussed in detail with patient. They have stated understanding and desire to proceed.  Propofol due to patient's report of significant insomnia as well as depression on Celexa. Check iron panel with ferritin. Add Benefiber or Metamucil daily. Add MiraLAX 1 capful (17 g) daily in 8 ounces of water to get bowels moving well.  May decrease frequency/discontinue if fiber supplement alone works well. Avoid NSAIDs. Follow-up after procedures.

## 2020-03-20 NOTE — Assessment & Plan Note (Addendum)
Patient is currently having BMs daily, but stools have become more hard over the last 2 months.  Prior to this, patient would have 1-2 soft/loose stools daily.  Notes he does not drink much water or eat many fruits.  He does like vegetables and notes hard stools improved with high-fiber cereal.  Chronic intermittent bright red blood per rectum for 30+ years in the setting of known hemorrhoids that prolapse at times.  Last colonoscopy about 10 years ago in [place], denies polyps.   Suspect mild constipation/hard stools likely secondary to dietary habits.  He does need colonoscopy to further evaluate rectal bleeding to confirm no other etiology such as colon polyps or malignancy as discussed above.  Add Benefiber or Metamucil daily. Add MiraLAX 1 capful (17 g) daily in 8 ounces of water.  May decrease frequency or discontinue if fiber supplements alone work well. Proceed with TCS with propofol with Dr. Gala Romney in the near future for further evaluation of rectal bleeding. The risks, benefits, and alternatives have been discussed in detail with patient. They have stated understanding and desire to proceed.  Propofol due to patient reporting significant insomnia as well as anxiety/depression on Celexa. Follow-up after procedures.

## 2020-03-21 ENCOUNTER — Ambulatory Visit (HOSPITAL_COMMUNITY)
Admission: RE | Admit: 2020-03-21 | Discharge: 2020-03-21 | Disposition: A | Discharge status: Home / Self Care | Payer: Medicare HMO | Source: Ambulatory Visit | Attending: Family | Admitting: Family

## 2020-03-21 ENCOUNTER — Other Ambulatory Visit: Payer: Self-pay

## 2020-03-21 DIAGNOSIS — R1012 Left upper quadrant pain: Secondary | ICD-10-CM | POA: Diagnosis not present

## 2020-03-21 DIAGNOSIS — K625 Hemorrhage of anus and rectum: Secondary | ICD-10-CM | POA: Diagnosis not present

## 2020-03-21 DIAGNOSIS — R109 Unspecified abdominal pain: Secondary | ICD-10-CM | POA: Diagnosis not present

## 2020-03-21 DIAGNOSIS — D649 Anemia, unspecified: Secondary | ICD-10-CM | POA: Diagnosis not present

## 2020-03-21 DIAGNOSIS — D72829 Elevated white blood cell count, unspecified: Secondary | ICD-10-CM | POA: Diagnosis not present

## 2020-03-21 LAB — POCT I-STAT CREATININE: Creatinine, Ser: 0.9 mg/dL (ref 0.61–1.24)

## 2020-03-21 MED ORDER — IOHEXOL 300 MG/ML  SOLN
100.0000 mL | Freq: Once | INTRAMUSCULAR | Status: AC | PRN
  Administered 2020-03-21: 100 mL via INTRAVENOUS

## 2020-03-22 ENCOUNTER — Encounter: Payer: Self-pay | Admitting: Nurse Practitioner

## 2020-03-22 ENCOUNTER — Ambulatory Visit (INDEPENDENT_AMBULATORY_CARE_PROVIDER_SITE_OTHER): Payer: Medicare HMO | Admitting: *Deleted

## 2020-03-22 ENCOUNTER — Ambulatory Visit (INDEPENDENT_AMBULATORY_CARE_PROVIDER_SITE_OTHER): Payer: Medicare HMO | Admitting: Nurse Practitioner

## 2020-03-22 ENCOUNTER — Other Ambulatory Visit: Payer: Self-pay | Admitting: Family

## 2020-03-22 VITALS — BP 114/73 | Wt 160.0 lb

## 2020-03-22 VITALS — BP 114/76 | HR 83 | Temp 98.2°F | Ht 70.0 in | Wt 160.6 lb

## 2020-03-22 DIAGNOSIS — R1030 Lower abdominal pain, unspecified: Secondary | ICD-10-CM

## 2020-03-22 DIAGNOSIS — Z0000 Encounter for general adult medical examination without abnormal findings: Secondary | ICD-10-CM | POA: Diagnosis not present

## 2020-03-22 DIAGNOSIS — N289 Disorder of kidney and ureter, unspecified: Secondary | ICD-10-CM

## 2020-03-22 DIAGNOSIS — N12 Tubulo-interstitial nephritis, not specified as acute or chronic: Secondary | ICD-10-CM

## 2020-03-22 DIAGNOSIS — F17210 Nicotine dependence, cigarettes, uncomplicated: Secondary | ICD-10-CM

## 2020-03-22 DIAGNOSIS — I714 Abdominal aortic aneurysm, without rupture, unspecified: Secondary | ICD-10-CM | POA: Insufficient documentation

## 2020-03-22 DIAGNOSIS — R110 Nausea: Secondary | ICD-10-CM

## 2020-03-22 LAB — URINALYSIS, COMPLETE
Bilirubin, UA: NEGATIVE
Glucose, UA: NEGATIVE
Nitrite, UA: NEGATIVE
RBC, UA: NEGATIVE
Specific Gravity, UA: 1.015 (ref 1.005–1.030)
Urobilinogen, Ur: 1 mg/dL (ref 0.2–1.0)
pH, UA: 7.5 (ref 5.0–7.5)

## 2020-03-22 LAB — IRON,TIBC AND FERRITIN PANEL
Ferritin: 77 ng/mL (ref 30–400)
Iron Saturation: 6 % — CL (ref 15–55)
Iron: 16 ug/dL — ABNORMAL LOW (ref 38–169)
Total Iron Binding Capacity: 253 ug/dL (ref 250–450)
UIBC: 237 ug/dL (ref 111–343)

## 2020-03-22 LAB — MICROSCOPIC EXAMINATION
Bacteria, UA: NONE SEEN
Epithelial Cells (non renal): NONE SEEN /hpf (ref 0–10)
RBC, Urine: NONE SEEN /hpf (ref 0–2)
Renal Epithel, UA: NONE SEEN /hpf

## 2020-03-22 MED ORDER — ONDANSETRON HCL 4 MG PO TABS: 4.0000 mg | ORAL_TABLET | Freq: Three times a day (TID) | ORAL | 0 refills | Status: DC | PRN

## 2020-03-22 MED ORDER — DOXYCYCLINE HYCLATE 100 MG PO TABS
100.0000 mg | ORAL_TABLET | Freq: Two times a day (BID) | ORAL | 0 refills | Status: DC
Start: 2020-03-22 — End: 2020-04-10

## 2020-03-22 MED ORDER — NICOTINE 7 MG/24HR TD PT24: 7.0000 mg | MEDICATED_PATCH | Freq: Every day | TRANSDERMAL | 0 refills | Status: DC

## 2020-03-22 NOTE — Assessment & Plan Note (Signed)
Extensive education provided to patient on smoking cessation, spent an ample amount of time with patient providing education on the dangers of smoking, aortic aneurism and dangers associated with of smoking. patient verbalize understanding and is willing to stop smoking. Nicoderm patch 7 mg /24 hr started today. Printed hand out given to patient.

## 2020-03-22 NOTE — Progress Notes (Signed)
Established Patient Office Visit  Subjective:  Patient ID: Michael Horton, male    DOB: 02/17/1946  Age: 74 y.o. MRN: [id number]  CC: No chief complaint on file.  Lower Abdominal pain   HPI Michael Horton presents for lower abdominal pain, ongoing since the last week, pain is dull and intermittent. Patient has not vomited and has no nausea or fever. Patient has used over the counter medication with only  temporary relief.   Patient is also present today for smoking cessation education. paitent has had a 74 year old smoking history. Patient is reporting increase fatigue and a possible aortic aneurysm complications. patient is scheduled for a repeat CT for possible kidney lesion,   Past Medical History:  Diagnosis Date  . Anxiety   . Depression   . GERD (gastroesophageal reflux disease)   . Hyperlipidemia     Past Surgical History:  Procedure Laterality Date  . ANKLE FRACTURE SURGERY Right 2017  . COLONOSCOPY     Per patient, this was in [place], no polyps  . HERNIA REPAIR    . SPINE SURGERY    . TIBIA FRACTURE SURGERY Right 2017    Family History  Problem Relation Age of Onset  . Dementia Mother   . Heart disease Mother   . Hypertension Father   . Lung cancer Paternal Uncle   . Throat cancer Paternal Grandfather   . Colon cancer Neg Hx   . Esophageal cancer Neg Hx   . Stomach cancer Neg Hx     Social History   Socioeconomic History  . Marital status: Married    Spouse name: [name]  . Number of children: 3  . Years of education: Not on file  . Highest education level: 11th grade  Occupational History  . Occupation: Retired  Tobacco Use  . Smoking status: Current Every Day Smoker    Packs/day: 0.50    Years: 56.00    Pack years: 28.00    Types: Cigarettes  . Smokeless tobacco: Never Used  Substance and Sexual Activity  . Alcohol use: No    Comment: rare  . Drug use: No  . Sexual activity: Yes    Birth control/protection: None  Other Topics Concern  . Not  on file  Social History Narrative  . Not on file   Social Determinants of Health   Financial Resource Strain:   . Difficulty of Paying Living Expenses:   Food Insecurity:   . Worried About Charity fundraiser in the Last Year:   . Arboriculturist in the Last Year:   Transportation Needs:   . Film/video editor (Medical):   Marland Kitchen Lack of Transportation (Non-Medical):   Physical Activity:   . Days of Exercise per Week:   . Minutes of Exercise per Session:   Stress:   . Feeling of Stress :   Social Connections:   . Frequency of Communication with Friends and Family:   . Frequency of Social Gatherings with Friends and Family:   . Attends Religious Services:   . Active Member of Clubs or Organizations:   . Attends Archivist Meetings:   Marland Kitchen Marital Status:   Intimate Partner Violence:   . Fear of Current or Ex-Partner:   . Emotionally Abused:   Marland Kitchen Physically Abused:   . Sexually Abused:     Outpatient Medications Prior to Visit  Medication Sig Dispense Refill  . citalopram (CELEXA) 40 MG tablet Take 1 tablet (40 mg total) by  mouth daily. (Patient taking differently: Take 20 mg by mouth daily. ) 90 tablet 4  . doxycycline (VIBRA-TABS) 100 MG tablet Take 1 tablet (100 mg total) by mouth 2 (two) times daily. 20 tablet 0  . Multiple Vitamin (MULTI VITAMIN DAILY PO) Take by mouth.    Marland Kitchen omeprazole (PRILOSEC) 20 MG capsule Take 1 capsule (20 mg total) by mouth 2 (two) times daily before a meal. (Patient taking differently: Take 20 mg by mouth daily. ) 180 capsule 1  . ondansetron (ZOFRAN) 4 MG tablet Take 1 tablet (4 mg total) by mouth every 8 (eight) hours as needed for nausea or vomiting. 20 tablet 0  . tamsulosin (FLOMAX) 0.4 MG CAPS capsule Take 1 capsule (0.4 mg total) by mouth daily. 30 capsule 1   No facility-administered medications prior to visit.    No Known Allergies  ROS Review of Systems  Constitutional: Negative.   HENT: Negative.   Eyes: Positive for  redness.  Respiratory: Negative for chest tightness and shortness of breath.   Cardiovascular: Negative for chest pain, palpitations and leg swelling.  Gastrointestinal: Positive for abdominal pain. Negative for abdominal distention and constipation.  Endocrine: Negative.   Genitourinary: Negative for difficulty urinating, dysuria and flank pain.  Musculoskeletal: Positive for back pain.  Skin: Negative for rash.       Dry  Psychiatric/Behavioral: The patient is not nervous/anxious.       Objective:    Physical Exam  Constitutional: He is oriented to person, place, and time. He appears well-developed and well-nourished.  HENT:  Mouth/Throat: Oropharynx is clear and moist.  Eyes: Conjunctivae are normal.  Cardiovascular: Normal rate and regular rhythm.  Pulmonary/Chest: Breath sounds normal.  Abdominal: Bowel sounds are normal. There is abdominal tenderness.  Nausea  Musculoskeletal:        General: No tenderness.  Neurological: He is alert and oriented to person, place, and time.  Skin: No rash noted.  Psychiatric: He has a normal mood and affect. His behavior is normal.    BP 114/76   Pulse 83   Temp 98.2 F (36.8 C) (Temporal)   Ht 5\' 10"  (1.778 m)   Wt 160 lb 9.6 oz (72.8 kg)   SpO2 97%   BMI 23.04 kg/m  Wt Readings from Last 3 Encounters:  03/22/20 160 lb 9.6 oz (72.8 kg)  03/19/20 164 lb 9.6 oz (74.7 kg)  03/15/20 161 lb (73 kg)     Health Maintenance Due  Topic Date Due  . COVID-19 Vaccine (2 - Moderna 2-dose series) 01/20/2020      Lab Results  Component Value Date   TSH 1.740 02/15/2020   Lab Results  Component Value Date   WBC 13.9 (H) 03/02/2020   HGB 12.0 (L) 03/02/2020   HCT 37.5 03/02/2020   MCV 77 (L) 03/02/2020   PLT 416 03/02/2020   Lab Results  Component Value Date   NA 134 02/15/2020   K 4.9 02/15/2020   CO2 25 02/15/2020   GLUCOSE 99 02/15/2020   BUN 14 02/15/2020   CREATININE 0.90 03/21/2020   BILITOT 0.2 02/15/2020    ALKPHOS 120 (H) 02/15/2020   AST 11 02/15/2020   ALT 7 02/15/2020   PROT 7.4 02/15/2020   ALBUMIN 4.3 02/15/2020   CALCIUM 10.0 02/15/2020   ANIONGAP 6 01/27/2017   Lab Results  Component Value Date   CHOL 197 02/15/2020   Lab Results  Component Value Date   HDL 44 02/15/2020   Lab Results  Component Value Date   LDLCALC 125 (H) 02/15/2020   Lab Results  Component Value Date   TRIG 159 (H) 02/15/2020   Lab Results  Component Value Date   CHOLHDL 4.5 02/15/2020   No results found for: HGBA1C    Assessment & Plan:  Abdominal pain Patient is back with uncontrolled lower abdominal pain, this is not a new problem for patient. Nausea medication Zofran started today, UA collected.  mucus in urine, doxycycline ordred for patient. Provided extensive education  To patient with printed hand out.  Patient knows to follow up as needed with unresolved or worsening symptoms  Smoking greater than 40 pack years Extensive education provided to patient on smoking cessation, spent an ample amount of time with patient providing education on the dangers of smoking, aortic aneurism and dangers associated with of smoking. patient verbalize understanding and is willing to stop smoking. Nicoderm patch 7 mg /24 hr started today. Printed hand out given to patient.  Problem List Items Addressed This Visit      Other   Abdominal pain - Primary   Relevant Orders   Urinalysis, Complete    Other Visit Diagnoses    Smoking greater than 40 pack years           Follow-up: Return if symptoms worsen or fail to improve.    Ivy Lynn, NP

## 2020-03-22 NOTE — Assessment & Plan Note (Signed)
Patient is back with uncontrolled lower abdominal pain, this is not a new problem for patient. Nausea medication Zofran started today, UA collected.  mucus in urine, doxycycline ordred for patient. Provided extensive education  To patient with printed hand out.  Patient knows to follow up as needed with unresolved or worsening symptoms

## 2020-03-22 NOTE — Patient Instructions (Addendum)
Abdominal pain Patient is back with uncontrolled lower abdominal pain, this is not a new problem for patient. Nausea medication Zofran started today, UA collected.  mucus in urine, doxycycline ordred for patient. Provided extensive education  To patient with printed hand out.  Patient knows to follow up as needed with unresolved or worsening symptoms  Smoking greater than 40 pack years Extensive education provided to patient on smoking cessation, spent an ample amount of time with patient providing education on the dangers of smoking, aortic aneurism and dangers associated with of smoking. patient verbalize understanding and is willing to stop smoking. Nicoderm patch 7 mg /24 hr started today. Printed hand out given to patient.    Smoking Tobacco Information, Adult Smoking tobacco can be harmful to your health. Tobacco contains a poisonous (toxic), colorless chemical called nicotine. Nicotine is addictive. It changes the brain and can make it hard to stop smoking. Tobacco also has other toxic chemicals that can hurt your body and raise your risk of many cancers. How can smoking tobacco affect me? Smoking tobacco puts you at risk for:  Cancer. Smoking is most commonly associated with lung cancer, but can also lead to cancer in other parts of the body.  Chronic obstructive pulmonary disease (COPD). This is a long-term lung condition that makes it hard to breathe. It also gets worse over time.  High blood pressure (hypertension), heart disease, stroke, or heart attack.  Lung infections, such as pneumonia.  Cataracts. This is when the lenses in the eyes become clouded.  Digestive problems. This may include peptic ulcers, heartburn, and gastroesophageal reflux disease (GERD).  Oral health problems, such as gum disease and tooth loss.  Loss of taste and smell. Smoking can affect your appearance by causing:  Wrinkles.  Yellow or stained teeth, fingers, and fingernails. Smoking tobacco can  also affect your social life, because:  It may be challenging to find places to smoke when away from home. Many workplaces, [employer], hotels, and public places are tobacco-free.  Smoking is expensive. This is due to the cost of tobacco and the long-term costs of treating health problems from smoking.  Secondhand smoke may affect those around you. Secondhand smoke can cause lung cancer, breathing problems, and heart disease. Children of smokers have a higher risk for: ? Sudden infant death syndrome (SIDS). ? Ear infections. ? Lung infections. If you currently smoke tobacco, quitting now can help you:  Lead a longer and healthier life.  Look, smell, breathe, and feel better over time.  Save money.  Protect others from the harms of secondhand smoke. What actions can I take to prevent health problems? Quit smoking   Do not start smoking. Quit if you already do.  Make a plan to quit smoking and commit to it. Look for programs to help you and ask your health care provider for recommendations and ideas.  Set a date and write down all the reasons you want to quit.  Let your friends and family know you are quitting so they can help and support you. Consider finding friends who also want to quit. It can be easier to quit with someone else, so that you can support each other.  Talk with your health care provider about using nicotine replacement medicines to help you quit, such as gum, lozenges, patches, sprays, or pills.  Do not replace cigarette smoking with electronic cigarettes, which are commonly called e-cigarettes. The safety of e-cigarettes is not known, and some may contain harmful chemicals.  If you  try to quit but return to smoking, stay positive. It is common to slip up when you first quit, so take it one day at a time.  Be prepared for cravings. When you feel the urge to smoke, chew gum or suck on hard candy. Lifestyle  Stay busy and take care of your body.  Drink enough  fluid to keep your urine pale yellow.  Get plenty of exercise and eat a healthy diet. This can help prevent weight gain after quitting.  Monitor your eating habits. Quitting smoking can cause you to have a larger appetite than when you smoke.  Find ways to relax. Go out with friends or family to a movie or a restaurant where people do not smoke.  Ask your health care provider about having regular tests (screenings) to check for cancer. This may include blood tests, imaging tests, and other tests.  Find ways to manage your stress, such as meditation, yoga, or exercise. Where to find support To get support to quit smoking, consider:  Asking your health care provider for more information and resources.  Taking classes to learn more about quitting smoking.  Looking for local organizations that offer resources about quitting smoking.  Joining a support group for people who want to quit smoking in your local community.  Calling the smokefree.gov counselor helpline: 1-800-Quit-Now 651-617-5348) Where to find more information You may find more information about quitting smoking from:  HelpGuide.org: www.helpguide.org  https://hall.com/: smokefree.gov  American Lung Association: www.lung.org Contact a health care provider if you:  Have problems breathing.  Notice that your lips, nose, or fingers turn blue.  Have chest pain.  Are coughing up blood.  Feel faint or you pass out.  Have other health changes that cause you to worry. Summary  Smoking tobacco can negatively affect your health, the health of those around you, your finances, and your social life.  Do not start smoking. Quit if you already do. If you need help quitting, ask your health care provider.  Think about joining a support group for people who want to quit smoking in your local community. There are many effective programs that will help you to quit this behavior. This information is not intended to replace  advice given to you by your health care provider. Make sure you discuss any questions you have with your health care provider. Document Revised: 07/08/2019 Document Reviewed: 10/28/2016 Elsevier Patient Education  2020 [place] with Quitting Smoking  Quitting smoking is a physical and mental challenge. You will face cravings, withdrawal symptoms, and temptation. Before quitting, work with your health care provider to make a plan that can help you cope. Preparation can help you quit and keep you from giving in. How can I cope with cravings? Cravings usually last for 5-10 minutes. If you get through it, the craving will pass. Consider taking the following actions to help you cope with cravings:  Keep your mouth busy: ? Chew sugar-free gum. ? Suck on hard candies or a straw. ? Brush your teeth.  Keep your hands and body busy: ? Immediately change to a different activity when you feel a craving. ? Squeeze or play with a ball. ? Do an activity or a hobby, like making bead jewelry, practicing needlepoint, or working with wood. ? Mix up your normal routine. ? Take a short exercise break. Go for a quick walk or run up and down stairs. ? Spend time in public places where smoking is not allowed.  Focus on doing  something kind or helpful for someone else.  Call a friend or family member to talk during a craving.  Join a support group.  Call a quit line, such as 1-800-QUIT-NOW.  Talk with your health care provider about medicines that might help you cope with cravings and make quitting easier for you. How can I deal with withdrawal symptoms? Your body may experience negative effects as it tries to get used to not having nicotine in the system. These effects are called withdrawal symptoms. They may include:  Feeling hungrier than normal.  Trouble concentrating.  Irritability.  Trouble sleeping.  Feeling depressed.  Restlessness and agitation.  Craving a cigarette. To  manage withdrawal symptoms:  Avoid places, people, and activities that trigger your cravings.  Remember why you want to quit.  Get plenty of sleep.  Avoid coffee and other caffeinated drinks. These may worsen some of your symptoms. How can I handle social situations? Social situations can be difficult when you are quitting smoking, especially in the first few weeks. To manage this, you can:  Avoid parties, bars, and other social situations where people might be smoking.  Avoid alcohol.  Leave right away if you have the urge to smoke.  Explain to your family and friends that you are quitting smoking. Ask for understanding and support.  Plan activities with friends or family where smoking is not an option. What are some ways I can cope with stress? Wanting to smoke may cause stress, and stress can make you want to smoke. Find ways to manage your stress. Relaxation techniques can help. For example:  Breathe slowly and deeply, in through your nose and out through your mouth.  Listen to soothing, relaxing music.  Talk with a family member or friend about your stress.  Light a candle.  Soak in a bath or take a shower.  Think about a peaceful place. What are some ways I can prevent weight gain? Be aware that many people gain weight after they quit smoking. However, not everyone does. To keep from gaining weight, have a plan in place before you quit and stick to the plan after you quit. Your plan should include:  Having healthy snacks. When you have a craving, it may help to: ? Eat plain popcorn, crunchy carrots, celery, or other cut vegetables. ? Chew sugar-free gum.  Changing how you eat: ? Eat small portion sizes at meals. ? Eat 4-6 small meals throughout the day instead of 1-2 large meals a day. ? Be mindful when you eat. Do not watch television or do other things that might distract you as you eat.  Exercising regularly: ? Make time to exercise each day. If you do not  have time for a long workout, do short bouts of exercise for 5-10 minutes several times a day. ? Do some form of strengthening exercise, like weight lifting, and some form of aerobic exercise, like running or swimming.  Drinking plenty of water or other low-calorie or no-calorie drinks. Drink 6-8 glasses of water daily, or as much as instructed by your health care provider. Summary  Quitting smoking is a physical and mental challenge. You will face cravings, withdrawal symptoms, and temptation to smoke again. Preparation can help you as you go through these challenges.  You can cope with cravings by keeping your mouth busy (such as by chewing gum), keeping your body and hands busy, and making calls to family, friends, or a helpline for people who want to quit smoking.  You can  cope with withdrawal symptoms by avoiding places where people smoke, avoiding drinks with caffeine, and getting plenty of rest.  Ask your health care provider about the different ways to prevent weight gain, avoid stress, and handle social situations. This information is not intended to replace advice given to you by your health care provider. Make sure you discuss any questions you have with your health care provider. Document Revised: 09/25/2017 Document Reviewed: 10/10/2016 Elsevier Patient Education  2020 [employer].

## 2020-03-22 NOTE — Progress Notes (Addendum)
MEDICARE ANNUAL WELLNESS VISIT  03/22/2020  Telephone Visit Disclaimer This Medicare AWV was conducted by telephone due to national recommendations for restrictions regarding the COVID-19 Pandemic (e.g. social distancing).  I verified, using two identifiers, that I am speaking with Michael Horton or their authorized healthcare agent. I discussed the limitations, risks, security, and privacy concerns of performing an evaluation and management service by telephone and the potential availability of an in-person appointment in the future. The patient expressed understanding and agreed to proceed.   Subjective:  Michael Horton is a 74 y.o. male patient of Hawks, Theador Hawthorne, FNP who had a Medicare Annual Wellness Visit today via telephone. Danner Paulding is Retired and lives with their family. he has 3 children. he reports that he is socially active and does interact with friends/family regularly. he is minimally physically active and enjoys gardening.  Patient Care Team: Sharion Balloon, FNP as PCP - General (Family Medicine) Roselyn Reef as Physician Assistant (Gastroenterology) Val Eagle, MD as Referring Physician (General Practice)  Advanced Directives 03/22/2020 03/16/2019 01/27/2017 01/02/2017  Does Patient Have a Medical Advance Directive? No No No No  Would patient like information on creating a medical advance directive? No - Patient declined No - Patient declined No - Patient declined No - Patient declined    Hospital Utilization Over the Past 12 Months: # of hospitalizations or ER visits: 0 # of surgeries: 0  Review of Systems    Patient reports that his overall health is worse compared to last year.  General ROS: negative  Patient Reported Readings (BP, Pulse, CBG, Weight, etc) BP 114/73   Wt 160 lb (72.6 kg)   BMI 22.96 kg/m    Pain Assessment       Current Medications & Allergies (verified) Allergies as of 03/22/2020   No Known Allergies      Medication  List        Accurate as of Mar 22, 2020  3:21 PM. If you have any questions, ask your nurse or doctor.          citalopram 40 MG tablet Commonly known as: CeleXA Take 1 tablet (40 mg total) by mouth daily. What changed: how much to take   doxycycline 100 MG tablet Commonly known as: VIBRA-TABS Take 1 tablet (100 mg total) by mouth 2 (two) times daily. Started by: Evelina Dun, FNP   MULTI VITAMIN DAILY PO Take by mouth.   nicotine 7 mg/24hr patch Commonly known as: NICODERM CQ - dosed in mg/24 hr Place 1 patch (7 mg total) onto the skin daily. Started by: Ivy Lynn, NP   omeprazole 20 MG capsule Commonly known as: PRILOSEC Take 1 capsule (20 mg total) by mouth 2 (two) times daily before a meal. What changed: when to take this   ondansetron 4 MG tablet Commonly known as: Zofran Take 1 tablet (4 mg total) by mouth every 8 (eight) hours as needed for nausea or vomiting.   tamsulosin 0.4 MG Caps capsule Commonly known as: Flomax Take 1 capsule (0.4 mg total) by mouth daily.        History (reviewed): Past Medical History:  Diagnosis Date  . Anxiety   . Depression   . GERD (gastroesophageal reflux disease)   . Hyperlipidemia    Past Surgical History:  Procedure Laterality Date  . ANKLE FRACTURE SURGERY Right 2017  . COLONOSCOPY     Per patient, this was in [place], no polyps  . HERNIA  REPAIR  1984  . SPINE SURGERY  1987   lumbar  . TIBIA FRACTURE SURGERY Right 2017   Family History  Problem Relation Age of Onset  . Dementia Mother   . Alzheimer's disease Mother   . Hypertension Mother   . Heart disease Father   . Lung cancer Paternal Uncle   . Throat cancer Paternal Grandfather   . Heart disease Brother   . Arthritis Brother        back issues   . Lymphoma Sister   . Colon cancer Neg Hx   . Esophageal cancer Neg Hx   . Stomach cancer Neg Hx    Social History   Socioeconomic History  . Marital status: Married    Spouse name: [name]   . Number of children: 3  . Years of education: Not on file  . Highest education level: 11th grade  Occupational History  . Occupation: Retired  Tobacco Use  . Smoking status: Current Every Day Smoker    Packs/day: 0.50    Years: 56.00    Pack years: 28.00    Types: Cigarettes  . Smokeless tobacco: Never Used  Substance and Sexual Activity  . Alcohol use: No    Comment: rare  . Drug use: No  . Sexual activity: Yes    Birth control/protection: None  Other Topics Concern  . Not on file  Social History Narrative  . Not on file   Social Determinants of Health   Financial Resource Strain:   . Difficulty of Paying Living Expenses:   Food Insecurity:   . Worried About Charity fundraiser in the Last Year:   . Arboriculturist in the Last Year:   Transportation Needs:   . Film/video editor (Medical):   Marland Kitchen Lack of Transportation (Non-Medical):   Physical Activity:   . Days of Exercise per Week:   . Minutes of Exercise per Session:   Stress:   . Feeling of Stress :   Social Connections:   . Frequency of Communication with Friends and Family:   . Frequency of Social Gatherings with Friends and Family:   . Attends Religious Services:   . Active Member of Clubs or Organizations:   . Attends Archivist Meetings:   Marland Kitchen Marital Status:     Activities of Daily Living In your present state of health, do you have any difficulty performing the following activities: 03/22/2020  Hearing? Y  Comment decreased left ear  Vision? Y  Comment reading  Difficulty concentrating or making decisions? N  Walking or climbing stairs? Y  Comment leg issues - goes slow  Dressing or bathing? N  Doing errands, shopping? N  Preparing Food and eating ? N  Using the Toilet? N  In the past six months, have you accidently leaked urine? N  Do you have problems with loss of bowel control? N  Managing your Medications? N  Managing your Finances? N  Housekeeping or managing your  Housekeeping? N  Some recent data might be hidden    Patient Education/ Literacy    Exercise Current Exercise Habits: The patient does not participate in regular exercise at present(signed up for water exercise), Type of exercise: walking, Time (Minutes): 30, Intensity: Mild, Exercise limited by: None identified  Diet Patient reports consuming 2 meals a day and 2 snack(s) a day Patient reports that his primary diet is: Regular Patient reports that she does have regular access to food.   Depression Screen PHQ  2/9 Scores 03/22/2020 03/16/2019 12/30/2018 01/02/2017 04/22/2016 03/26/2016 03/12/2016  PHQ - 2 Score 2 0 0 0 0 0 0  PHQ- 9 Score 10 - - - - - -     Fall Risk Fall Risk  03/22/2020 03/22/2020 03/15/2020 03/15/2020 01/13/2020  Falls in the past year? 1 1 0 0 0  Number falls in past yr: 0 0 0 0 -  Injury with Fall? 0 0 0 0 -  Risk for fall due to : - Impaired balance/gait - - -  Follow up Falls evaluation completed Falls evaluation completed - - -     Objective:  Michael Horton seemed alert and oriented and he participated appropriately during our telephone visit.  Blood Pressure Weight BMI  BP Readings from Last 3 Encounters:  03/22/20 114/73  03/22/20 114/76  03/19/20 107/64   Wt Readings from Last 3 Encounters:  03/22/20 160 lb (72.6 kg)  03/22/20 160 lb 9.6 oz (72.8 kg)  03/19/20 164 lb 9.6 oz (74.7 kg)   BMI Readings from Last 1 Encounters:  03/22/20 22.96 kg/m    *Unable to obtain current vital signs, weight, and BMI due to telephone visit type  Hearing/Vision  . John D did not seem to have difficulty with hearing/understanding during the telephone conversation . Reports that he has not had a formal eye exam by an eye care professional within the past year . Reports that he has not had a formal hearing evaluation within the past year *Unable to fully assess hearing and vision during telephone visit type  Cognitive Function: 6CIT Screen 03/22/2020 03/16/2019  What Year?  0 points 0 points  What month? 0 points 0 points  What time? 0 points 0 points  Count back from 20 0 points 0 points  Months in reverse 0 points 2 points  Repeat phrase 0 points 0 points  Total Score 0 2   (Normal:0-7, Significant for Dysfunction: >8)  Normal Cognitive Function Screening: Yes   Immunization & Health Maintenance Record Immunization History  Administered Date(s) Administered  . Influenza, High Dose Seasonal PF 12/30/2018  . Influenza,inj,Quad PF,6+ Mos 09/01/2014  . Moderna SARS-COVID-2 Vaccination 12/29/2019, 01/24/2020  . Pneumococcal Conjugate-13 01/02/2017  . Pneumococcal Polysaccharide-23 12/30/2018  . Tdap 01/27/2017    Health Maintenance  Topic Date Due  . INFLUENZA VACCINE  05/27/2020  . COLONOSCOPY  10/09/2021  . TETANUS/TDAP  01/28/2027  . COVID-19 Vaccine  Completed  . Hepatitis C Screening  Completed  . PNA vac Low Risk Adult  Completed       Assessment  This is a routine wellness examination for [place] Maintenance: Due or Overdue There are no preventive care reminders to display for this patient.  Michael Horton does not need a referral for Community Assistance: Care Management:   no Social Work:    no Prescription Assistance:  no Nutrition/Diabetes Education:  no   Plan:  Personalized Goals Goals Addressed             This Visit's Progress   . DIET - INCREASE WATER INTAKE   Not on track   . Quit Smoking         Personalized Health Maintenance & Screening Recommendations  Advanced directives: will pick up forms   Lung Cancer Screening Recommended: no (Low Dose CT Chest recommended if Age 69-80 years, 30 pack-year currently smoking OR have quit w/in past 15 years) Hepatitis C Screening recommended: no HIV Screening recommended: no  Advanced Directives:  Written information was not prepared per patient's request.  Referrals & Orders No orders of the defined types were placed in this encounter.   Follow-up  Plan . Follow-up with Sharion Balloon, FNP as planned   I hav   personally reviewed and noted the following in the patient's chart:   . Medical and social history . Use of alcohol, tobacco or illicit drugs  . Current medications and supplements . Functional ability and status . Nutritional status . Physical activity . Advanced directives . List of other physicians . Hospitalizations, surgeries, and ER visits in previous 12 months . Vitals . Screenings to include cognitive, depression, and falls . Referrals and appointments  In addition, I have reviewed and discussed with Michael Horton certain preventive protocols, quality metrics, and best practice recommendations. A written personalized care plan for preventive services as well as general preventive health recommendations is available and can be mailed to the patient at his request.      B[place]ins, Cameron Proud  03/22/2020  I have reviewed and agree with the above AWV documentation.   Jac Canavan, FNP

## 2020-03-22 NOTE — Patient Instructions (Signed)
  Michael Horton , Thank you for taking time to come for your Medicare Wellness Visit. I appreciate your ongoing commitment to your health goals. Please review the following plan we discussed and let me know if I can assist you in the future.   These are the goals we discussed: Goals    . DIET - INCREASE WATER INTAKE    . Quit Smoking       This is a list of the screening recommended for you and due dates:  Health Maintenance  Topic Date Due  . Flu Shot  05/27/2020  . Colon Cancer Screening  10/09/2021  . Tetanus Vaccine  01/28/2027  . COVID-19 Vaccine  Completed  .  Hepatitis C: One time screening is recommended by Center for Disease Control  (CDC) for  adults born from [birth year] through 1964.   Completed  . Pneumonia vaccines  Completed

## 2020-03-22 NOTE — Progress Notes (Signed)
Called and Spoke with wife about CT results. Doxycyline Prescription sent to pharmacy. Will place repeat CT scan to have completed in 4 weeks. I have also placed a referral to Vascular Surgery and discussed smoking cessation.

## 2020-03-22 NOTE — Progress Notes (Signed)
Iron panel suggest he does have a component of iron deficiency. He should start taking OTC iron (325 mg ferrous sulfate) daily. He will need to hold iron x7 days prior to procedures.   Please let him know iron can cause constipation. He should continue with prior recommendations of daily fiber supplement and MiraLAX daily. If this doesn't help with constipation, he should let me know and we can try something different.   [facility] Clinical Pool: Please add to his procedure instructions to hold iron x 7 days.

## 2020-03-23 ENCOUNTER — Other Ambulatory Visit: Payer: Self-pay | Admitting: Family

## 2020-03-27 ENCOUNTER — Telehealth: Payer: Self-pay | Admitting: Family

## 2020-03-27 DIAGNOSIS — N2889 Other specified disorders of kidney and ureter: Secondary | ICD-10-CM

## 2020-03-27 NOTE — Telephone Encounter (Signed)
Spoke with wife and she had concerns regarding husband still feeling weak and nauseated and up going to the bathroom every 30 minutes so he isn't sleeping at night. He had been started on Flomax by Romilda Joy for this problem but it made it worse. She would like to know if there is anything besides the Flomax he could take to "regulate his pee". Advised her also that antibiotic itself can cause nausea, vomiting and other GI upset which can be normal and to keep him hydrated. Pt denies fever or any other urinary symptoms. Please advise.

## 2020-03-27 NOTE — Telephone Encounter (Signed)
Wife aware. States [facility] can not get him until 04/21/20. She would like to go see someone sooner can you send him to anyone else?

## 2020-03-27 NOTE — Telephone Encounter (Signed)
Pts wife calling with questions regarding her husband. She is wanting to know how long it will take for the antibiotics to help with his kidneys as he is not eating well and she is concerned. She states that he continues to be very nauseated. Requesting to speak with a nurse.

## 2020-03-27 NOTE — Telephone Encounter (Signed)
Spoke with patient and his Wife  - Aware that [facility] are past 6/25 scheduling date.

## 2020-03-27 NOTE — Telephone Encounter (Signed)
I have placed a referral to Oncologists to rule out kidney mass. Urine did not show infection. Continue antibitoic.

## 2020-03-29 ENCOUNTER — Telehealth: Payer: Self-pay | Admitting: Family

## 2020-03-30 ENCOUNTER — Encounter (HOSPITAL_COMMUNITY): Payer: Self-pay

## 2020-03-30 ENCOUNTER — Other Ambulatory Visit: Payer: Self-pay

## 2020-04-02 ENCOUNTER — Inpatient Hospital Stay (HOSPITAL_COMMUNITY): Payer: Medicare HMO | Attending: Hematology | Admitting: Hematology

## 2020-04-02 ENCOUNTER — Encounter (HOSPITAL_COMMUNITY): Payer: Self-pay | Admitting: Hematology

## 2020-04-02 ENCOUNTER — Other Ambulatory Visit: Payer: Self-pay

## 2020-04-02 VITALS — BP 130/82 | HR 76 | Temp 96.8°F | Resp 18 | Ht 70.0 in | Wt 161.0 lb

## 2020-04-02 DIAGNOSIS — F329 Major depressive disorder, single episode, unspecified: Secondary | ICD-10-CM | POA: Diagnosis not present

## 2020-04-02 DIAGNOSIS — D509 Iron deficiency anemia, unspecified: Secondary | ICD-10-CM | POA: Insufficient documentation

## 2020-04-02 DIAGNOSIS — N2889 Other specified disorders of kidney and ureter: Secondary | ICD-10-CM | POA: Insufficient documentation

## 2020-04-02 DIAGNOSIS — Z87891 Personal history of nicotine dependence: Secondary | ICD-10-CM | POA: Insufficient documentation

## 2020-04-02 DIAGNOSIS — J189 Pneumonia, unspecified organism: Secondary | ICD-10-CM | POA: Insufficient documentation

## 2020-04-02 DIAGNOSIS — Z801 Family history of malignant neoplasm of trachea, bronchus and lung: Secondary | ICD-10-CM | POA: Insufficient documentation

## 2020-04-02 DIAGNOSIS — Z807 Family history of other malignant neoplasms of lymphoid, hematopoietic and related tissues: Secondary | ICD-10-CM | POA: Insufficient documentation

## 2020-04-02 DIAGNOSIS — N200 Calculus of kidney: Secondary | ICD-10-CM | POA: Insufficient documentation

## 2020-04-02 DIAGNOSIS — I714 Abdominal aortic aneurysm, without rupture: Secondary | ICD-10-CM | POA: Diagnosis not present

## 2020-04-02 NOTE — Progress Notes (Signed)
[facility] [address] [place] [address]   CLINIC:  Medical Oncology/Hematology  CONSULT NOTE  Patient Care Team: Sharion Balloon, FNP as PCP - General (Family Medicine) Roselyn Reef as Physician Assistant (Gastroenterology) Val Eagle, MD as Referring Physician (General Practice)  CHIEF COMPLAINTS/PURPOSE OF CONSULTATION:  Evaluation of kidney mass   HISTORY OF PRESENTING ILLNESS:  Mr. Michael Horton 74 y.o. 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  He is here with his wife, Burman Nieves, who is providing most of the history. He was referred to oncology after a CT abd/pelvis on 03/21/2020, after having abdominal pain and decreased energy and weight loss from not eating due to nausea. His weight 6 weeks ago was 171 lbs and today is 161 lbs. He reports still feeling nausea today. He finished his course of doxycycline 2 days ago. He feels better since his abx treatment. He had a UTI several years back, but no prior pyelonephritis. He has a productive cough with clear sputum. He denies having fever/chills. He will have another CT scan done on 04/27/2020.   He and his family moved here in 2014. He has a distant history of peptic ulcer. He started taking iron pills 2 weeks ago and takes Dulcolax every day. He has not had a recent EGD or colonoscopy done. He is a former smoker and quit completely on 03/16/2020. He has smoked 1/2 to 1 pack a day since 74 years old. 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 His PGF had throat cancer, paternal uncle had lung cancer.   MEDICAL HISTORY:  Past Medical History:  Diagnosis Date   Anxiety    Depression    GERD (gastroesophageal reflux disease)    Hyperlipidemia     SURGICAL HISTORY: Past Surgical History:  Procedure Laterality Date   ANKLE FRACTURE SURGERY Right 2017   COLONOSCOPY     Per  patient, this was in [place], no polyps   [place]   lumbar   TIBIA FRACTURE SURGERY Right 2017    SOCIAL HISTORY: Social History   Socioeconomic History   Marital status: Married    Spouse name: [name]   Number of children: 3   Years of education: Not on file   Highest education level: 11th grade  Occupational History   Occupation: RETIRED  Tobacco Use   Smoking status: Former Smoker    Packs/day: 0.50    Years: 56.00    Pack years: 28.00    Types: Cigarettes   Smokeless tobacco: Never Used   Tobacco comment: quit 03/21/2020  Substance and Sexual Activity   Alcohol use: No    Comment: rare   Drug use: No   Sexual activity: Yes    Birth control/protection: None  Other Topics Concern   Not on file  Social History Narrative   Not on file   Social Determinants of Health   Financial Resource Strain: Low Risk    Difficulty of Paying Living Expenses: Not hard at all  Food Insecurity: No Food Insecurity   Worried About Charity fundraiser in the Last Year: Never true   [place] in the Last Year: Never true  Transportation Needs: No Transportation Needs   Lack of Transportation (Medical): No   Lack of Transportation (Non-Medical): No  Physical Activity: Inactive  Days of Exercise per Week: 0 days   Minutes of Exercise per Session: 0 min  Stress: Stress Concern Present   Feeling of Stress : To some extent  Social Connections: Somewhat Isolated   Frequency of Communication with Friends and Family: Once a week   Frequency of Social Gatherings with Friends and Family: Once a week   Attends Religious Services: 1 to 4 times per year   Active Member of [employer] or Organizations: No   Attends Music therapist: Never   Marital Status: Married  Human resources officer Violence: Not At Risk   Fear of Current or Ex-Partner: No   Emotionally Abused: No   Physically Abused: No   Sexually Abused:  No    FAMILY HISTORY: Family History  Problem Relation Age of Onset   Dementia Mother    Alzheimer's disease Mother    Hypertension Mother    Heart disease Father    Glaucoma Father    Lung cancer Paternal Uncle    Throat cancer Paternal Grandfather    Heart disease Brother    Arthritis Brother        back issues    Lymphoma Sister    Colon cancer Neg Hx    Esophageal cancer Neg Hx    Stomach cancer Neg Hx     ALLERGIES:  has No Known Allergies.  MEDICATIONS:  Current Outpatient Medications  Medication Sig Dispense Refill   citalopram (CELEXA) 40 MG tablet Take 1 tablet (40 mg total) by mouth daily. (Patient taking differently: Take 20 mg by mouth daily. ) 90 tablet 4   doxycycline (VIBRA-TABS) 100 MG tablet Take 1 tablet (100 mg total) by mouth 2 (two) times daily. 20 tablet 0   Multiple Vitamin (MULTI VITAMIN DAILY PO) Take by mouth.     nicotine (NICODERM CQ - DOSED IN MG/24 HR) 7 mg/24hr patch Place 1 patch (7 mg total) onto the skin daily. 28 patch 0   omeprazole (PRILOSEC) 20 MG capsule Take 1 capsule (20 mg total) by mouth 2 (two) times daily before a meal. (Patient taking differently: Take 20 mg by mouth daily. ) 180 capsule 1   ondansetron (ZOFRAN) 4 MG tablet Take 1 tablet (4 mg total) by mouth every 8 (eight) hours as needed for nausea or vomiting. (Patient not taking: Reported on 03/30/2020) 20 tablet 0   Pumpkin Seed-Soy Germ (AZO BLADDER CONTROL/GO-LESS PO) Take 1 capsule by mouth in the morning, at noon, and at bedtime.     No current facility-administered medications for this visit.    REVIEW OF SYSTEMS:   Review of Systems  Constitutional: Positive for appetite change (moderately decreased) and fatigue (severe).  Respiratory: Positive for cough (productive) and shortness of breath.   Cardiovascular: Positive for chest pain.  Gastrointestinal: Positive for constipation and nausea.  Genitourinary: Negative for hematuria.   Neurological:  Positive for headaches and numbness (hands).  All other systems reviewed and are negative.    PHYSICAL EXAMINATION: ECOG PERFORMANCE STATUS: 1 - Symptomatic but completely ambulatory  Vitals:   04/02/20 0819  BP: 130/82  Pulse: 76  Resp: 18  Temp: (!) 96.8 F (36 C)  SpO2: 99%   Filed Weights   04/02/20 0819  Weight: 161 lb (73 kg)   Physical Exam Vitals reviewed.  Constitutional:      Appearance: Normal appearance.  Cardiovascular:     Rate and Rhythm: Normal rate and regular rhythm.     Pulses: Normal pulses.     Heart  sounds: Normal heart sounds.  Pulmonary:     Effort: Pulmonary effort is normal.     Breath sounds: Rhonchi (L base) present.  Abdominal:     Palpations: Abdomen is soft. There is no mass.     Tenderness: There is no abdominal tenderness.  Musculoskeletal:     Right lower leg: No edema.     Left lower leg: No edema.  Lymphadenopathy:     Cervical: No cervical adenopathy.     Upper Body:     Right upper body: No supraclavicular or axillary adenopathy.     Left upper body: No supraclavicular or axillary adenopathy.  Skin:    Findings: No rash.  Neurological:     General: No focal deficit present.     Mental Status: He is alert and oriented to person, place, and time.  Psychiatric:        Mood and Affect: Mood normal.        Behavior: Behavior normal.      LABORATORY DATA:  I have reviewed the data as listed CBC Latest Ref Rng & Units 03/02/2020 02/15/2020 12/02/2018  WBC 3.4 - 10.8 x10E3/uL 13.9(H) 12.8(H) 9.0  Hemoglobin 13.0 - 17.7 g/dL 12.0(L) 11.6(L) 12.5(L)  Hematocrit 37.5 - 51.0 % 37.5 36.2(L) 40.3  Platelets 150 - 450 x10E3/uL 416 410 283   CMP Latest Ref Rng & Units 03/21/2020 02/15/2020 12/02/2018  Glucose 65 - 99 mg/dL - 99 80  BUN 8 - 27 mg/dL - 14 13  Creatinine 0.61 - 1.24 mg/dL 0.90 0.97 0.92  Sodium 134 - 144 mmol/L - 134 139  Potassium 3.5 - 5.2 mmol/L - 4.9 5.4(H)  Chloride 96 - 106 mmol/L - 96 100  CO2 20 - 29 mmol/L - 25  22  Calcium 8.6 - 10.2 mg/dL - 10.0 10.1  Total Protein 6.0 - 8.5 g/dL - 7.4 7.2  Total Bilirubin 0.0 - 1.2 mg/dL - 0.2 0.2  Alkaline Phos 39 - 117 IU/L - 120(H) 109  AST 0 - 40 IU/L - 11 12  ALT 0 - 44 IU/L - 7 9    RADIOGRAPHIC STUDIES: I have personally reviewed the radiological images as listed and agreed with the findings in the report. CT ABDOMEN PELVIS W CONTRAST  Result Date: 03/21/2020 CLINICAL DATA:  Abdominal pain, epigastric pain radiating to left hemiabdomen for 3 weeks, history of hernia repair EXAM: CT ABDOMEN AND PELVIS WITH CONTRAST TECHNIQUE: Multidetector CT imaging of the abdomen and pelvis was performed using the standard protocol following bolus administration of intravenous contrast. CONTRAST:  136mL OMNIPAQUE IOHEXOL 300 MG/ML  SOLN COMPARISON:  None. FINDINGS: Lower chest: Moderate hiatal hernia, partially imaged. Dense consolidation of the included portion of the left lower lobe with associated trace pleural effusion. Hepatobiliary: No solid liver abnormality is seen. No gallstones, gallbladder wall thickening, or biliary dilatation. Pancreas: Unremarkable. No pancreatic ductal dilatation or surrounding inflammatory changes. Spleen: Normal in size without significant abnormality. Adrenals/Urinary Tract: Adrenal glands are unremarkable. Multiple small nonobstructive right renal calculi. Indistinct, hypodense lesion of the anterior midportion of the left kidney (series 2, image 32). Bladder is unremarkable. Stomach/Bowel: Stomach is within normal limits. Appendix appears normal. No evidence of bowel wall thickening, distention, or inflammatory changes. 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 Severe mixed  calcific atherosclerosis. No enlarged abdominal or pelvic lymph  nodes. Reproductive: No mass or other significant abnormality. Other: No abdominal wall hernia or abnormality. No abdominopelvic ascites. Musculoskeletal: No acute or significant osseous findings. IMPRESSION: 1. 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 No obvious signs of rupture by CT given reported abdominal pain. 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 Aortic Atherosclerosis (ICD10-I70.0). 2. Indistinct, hypodense lesion of the anterior midportion of the left kidney, which may be related to pyelonephritis given left-sided abdominal pain. Correlate with urinalysis. Consider follow-up multiphasic contrast enhanced CT in approximately 4-6 weeks to assess for resolution and to exclude underlying mass. 3. Dense consolidation of the included portion of the left lower lobe with associated trace pleural effusion, concerning for infection or aspiration. 4. Moderate hiatal hernia, partially imaged, which may place the patient at risk of aspiration. 5. Nonobstructive right nephrolithiasis.  No hydronephrosis. These results will be called to the ordering clinician or representative by the Radiologist Assistant, and communication documented in the PACS or [employer]. Electronically Signed   By: Eddie Candle M.D.   On: 03/21/2020 14:05    ASSESSMENT:  1.  Hypodense lesion of the left kidney: -CTAP on 03/21/2020 showed indistinct hypodense lesion of the anterior midportion of the left kidney.  Multiple small nonobstructive right renal calculi. -Denies any hematuria.  2.  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 -He has a vascular appointment end of this month.  3.  Microcytic anemia: -Hemoglobin was 12 with MCV of 77. -Ferritin was 77 and percent saturation was six.  Creatinine was normal. -There might be a  component of thalassemia trait.    PLAN:  1.  Hypodense lesion of the left kidney: -We reviewed scans with the patient and his wife. -We will schedule him for multiphasic contrast-enhanced CT in 3 to 4 weeks. -We will also make referral to psychologist as the patient is feeling depressed.  He is already on Lexapro.  2.  Microcytic anemia: -Patient started taking iron tablet 2 weeks ago. -We will plan to repeat CBC, ferritin and iron panel prior to next visit.  3.  Pneumonia: -Dense consolidation in the left lower lobe of lung with associated trace pleural effusion. -He was treated with doxycycline until 2 days ago. -He reports improvement in his cough although he has some minor cough.   All questions were answered. The patient knows to call the clinic with any problems, questions or concerns.  Derek Jack, MD, 04/02/20 8:25 AM  [facility] [phone]   I, Milinda Antis, am acting as a scribe for Dr. Sanda Linger.  I, Derek Jack MD, have reviewed the above documentation for accuracy and completeness, and I agree with the above.

## 2020-04-02 NOTE — Patient Instructions (Addendum)
[place] at [facility] Discharge Instructions  You were seen today by Dr. Delton Coombes. He went over your recent results and possible diagnoses. Please keep your previously scheduled CT scan. Dr. Delton Coombes will see you back in 4 weeks for labs and follow up.   Thank you for choosing [place] at [facility] to provide your oncology and hematology care.  To afford each patient quality time with our provider, please arrive at least 15 minutes before your scheduled appointment time.   If you have a lab appointment with the [facility] please come in thru the  Main Entrance and check in at the main information desk  You need to re-schedule your appointment should you arrive 10 or more minutes late.  We strive to give you quality time with our providers, and arriving late affects you and other patients whose appointments are after yours.  Also, if you no show three or more times for appointments you may be dismissed from the clinic at the providers discretion.     Again, thank you for choosing [facility].  Our hope is that these requests will decrease the amount of time that you wait before being seen by our physicians.       _____________________________________________________________  Should you have questions after your visit to [facility], please contact our office at [phone] between the hours of 8:00 a.m. and 4:30 p.m.  Voicemails left after 4:00 p.m. will not be returned until the following business day.  For prescription refill requests, have your pharmacy contact our office and allow 72 hours.    Cancer Center Support Programs:   > Cancer Support Group  2nd Tuesday of the month 1pm-2pm, Journey Room

## 2020-04-03 ENCOUNTER — Encounter: Payer: Self-pay | Admitting: General Practice
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
## 2020-04-04 ENCOUNTER — Other Ambulatory Visit (HOSPITAL_COMMUNITY): Payer: Self-pay | Admitting: *Deleted

## 2020-04-04 DIAGNOSIS — F32A Depression, unspecified: Secondary | ICD-10-CM

## 2020-04-10 ENCOUNTER — Telehealth: Payer: Self-pay

## 2020-04-10 ENCOUNTER — Telehealth (INDEPENDENT_AMBULATORY_CARE_PROVIDER_SITE_OTHER): Payer: Medicare HMO | Admitting: Family

## 2020-04-10 ENCOUNTER — Encounter: Payer: Self-pay | Admitting: Family

## 2020-04-10 DIAGNOSIS — N289 Disorder of kidney and ureter, unspecified: Secondary | ICD-10-CM | POA: Diagnosis not present

## 2020-04-10 DIAGNOSIS — J189 Pneumonia, unspecified organism: Secondary | ICD-10-CM

## 2020-04-10 DIAGNOSIS — I714 Abdominal aortic aneurysm, without rupture, unspecified: Secondary | ICD-10-CM

## 2020-04-10 DIAGNOSIS — R630 Anorexia: Secondary | ICD-10-CM

## 2020-04-10 DIAGNOSIS — R110 Nausea: Secondary | ICD-10-CM | POA: Diagnosis not present

## 2020-04-10 DIAGNOSIS — R634 Abnormal weight loss: Secondary | ICD-10-CM

## 2020-04-10 DIAGNOSIS — R1030 Lower abdominal pain, unspecified: Secondary | ICD-10-CM | POA: Diagnosis not present

## 2020-04-10 DIAGNOSIS — R109 Unspecified abdominal pain: Secondary | ICD-10-CM | POA: Diagnosis not present

## 2020-04-10 MED ORDER — ONDANSETRON HCL 4 MG PO TABS: 4.0000 mg | ORAL_TABLET | Freq: Three times a day (TID) | ORAL | 1 refills | Status: AC | PRN

## 2020-04-10 MED ORDER — DICYCLOMINE HCL 20 MG PO TABS: 20.0000 mg | ORAL_TABLET | Freq: Four times a day (QID) | ORAL | 1 refills | Status: AC | PRN

## 2020-04-10 NOTE — Progress Notes (Signed)
Virtual Visit via telephone Note Due to COVID-19 pandemic this visit was conducted virtually. This visit type was conducted due to national recommendations for restrictions regarding the COVID-19 Pandemic (e.g. social distancing, sheltering in place) in an effort to limit this patient's exposure and mitigate transmission in our community. All issues noted in this document were discussed and addressed.  A physical exam was not performed with this format.  I connected with Michael Horton on 04/10/20 at 3:03 pm by telephone and verified that I am speaking with the correct person using two identifiers. Michael Horton is currently located at home and wife is currently with her during visit. The provider, Evelina Dun, FNP is located in their office at time of visit.  I discussed the limitations, risks, security and privacy concerns of performing an evaluation and management service by telephone and the availability of in person appointments. I also discussed with the patient that there may be a patient responsible charge related to this service. The patient expressed understanding and agreed to proceed.   History and Present Illness:  Pt and his wife call today with recurrent abdominal pain. He continues to have this pain and had a CT scan on 03/21/20 that showed an abdominal aorta aneurysm, hypodense lesion of left kidney. He has follow up with Vascular on 04/20/20. He has seen Oncologists and and follow up after his repeat CT abdomen on 04/27/20.   Abdominal Pain This is a recurrent problem. The onset quality is gradual. The problem occurs intermittently. The problem has been waxing and waning. The pain is located in the RLQ and LLQ. The pain is at a severity of 6/10. The pain is moderate. The quality of the pain is cramping. Associated symptoms include nausea. Pertinent negatives include no belching, constipation, diarrhea, dysuria, fever, flatus, frequency, hematuria or vomiting. Associated symptoms  comments: Weight loss, nausea, and vomiting . Nothing aggravates the pain. The pain is relieved by bowel movements and belching. He has tried proton pump inhibitors for the symptoms. The treatment provided mild relief.      Review of Systems  Constitutional: Negative for fever.  Gastrointestinal: Positive for abdominal pain and nausea. Negative for constipation, diarrhea, flatus and vomiting.  Genitourinary: Negative for dysuria, frequency and hematuria.  All other systems reviewed and are negative.    Observations/Objective: No SOB or distress noted   Assessment and Plan: 1. Lower abdominal pain Will try Bentyl as needed for abdominal cramping He will call GI today and make follow up appt, given pain, nausea, and weight loss - dicyclomine (BENTYL) 20 MG tablet; Take 1 tablet (20 mg total) by mouth 4 (four) times daily as needed for spasms.  Dispense: 90 tablet; Refill: 1  2. Nausea Zofran as needed - ondansetron (ZOFRAN) 4 MG tablet; Take 1 tablet (4 mg total) by mouth every 8 (eight) hours as needed for nausea or vomiting.  Dispense: 90 tablet; Refill: 1  3. Decreased appetite  4. Weight loss  5. Abdominal cramping - dicyclomine (BENTYL) 20 MG tablet; Take 1 tablet (20 mg total) by mouth 4 (four) times daily as needed for spasms.  Dispense: 90 tablet; Refill: 1  6. Community acquired pneumonia, unspecified laterality - DG Chest 2 View; Future  7. Abdominal aortic aneurysm (AAA) without rupture ([place]) Keep appt with Vascular  8. Kidney lesion Keep appt with Oncologists and follow up CT scan      I discussed the assessment and treatment plan with the patient. The patient was provided an  opportunity to ask questions and all were answered. The patient agreed with the plan and demonstrated an understanding of the instructions.   The patient was advised to call back or seek an in-person evaluation if the symptoms worsen or if the condition fails to improve as  anticipated.  The above assessment and management plan was discussed with the patient. The patient verbalized understanding of and has agreed to the management plan. Patient is aware to call the clinic if symptoms persist or worsen. Patient is aware when to return to the clinic for a follow-up visit. Patient educated on when it is appropriate to go to the emergency department.   Time call ended:  3:23 pm   I provided 20 minutes of non-face-to-face time during this encounter.    Evelina Dun, FNP

## 2020-04-10 NOTE — Telephone Encounter (Signed)
Pt was seen on 03/19/20. Pt had a virtual apt with his PCP, note is in system. C/o lower abdominal pain, loss of appetite, weight loss, nausea with no vomiting. Pt eats and doesn't have much pain after that.  Pt had a CT scan at the end of May and it reveal an abdominal aortic aneurysm. Pt is going to be seen by the vascular surgeon. Pt met with an oncologist and there was no sign of cancer.  It also revealed pneumonia and the pt taken antibiotics. Pt has been taking iron and was constipated but is back to normal now after taking stool softeners. Pt has always had looser stool per his spouse but not diarrhea.

## 2020-04-10 NOTE — Telephone Encounter (Signed)
When did the lower abdominal pain start? When I saw him, pain was primarily in the upper abdomen. How severe on a scale of 1-10? Is it associated with meals or BMs? Is he having Bms daily? When I saw him last, he was having hard/firm stool daily and I recommended daily fiber supplement as well as MiraLAX 17g daily in 8 oz of water.   Looks like PCP just started him on Bentyl for the lower abdominal pain. He will need to be careful with this as a side effect of this is constipation.   We are working on pursuing EGD/TCS in the near future for nausea/upper abdominal pain/rectal bleeding/weight loss unfortunately we have not been able to schedule this yet.   Is he taking omeprazole 20 mg BID, Zofran q8hours to help with upper GI symptoms.  Any NSAIDs?

## 2020-04-11 NOTE — Telephone Encounter (Signed)
Lmom, waiting on a return call.  

## 2020-04-12 NOTE — Telephone Encounter (Signed)
Spoke with pts spouse. She states the pain started in May. She said yes, pts pain was upper abdomen when he can in and it seems to be lower now. Pts pain level gets to a 5-6. Pt is taking a stool softener daily due to being on iron pills. Pt is aware that he can take the Bentyl as prescribed by his PCP and to watch out for any constipation. They are aware that Miralax is an option to take as well. Pt is having daily Bm(1-3 times daily) and his stool isn't formed but not watery. BM is more soft textured. Pt is takes Omeprazole 20 mg daily and is aware that he should take it bid as directed. Pt takes Zofran as needed only and hasn't had it in a few days. Pt has felt a little better since he took the Bentyl prescribed by the PCP. Pt isn't taking any NSAIDS per pt and spouse.

## 2020-04-13 ENCOUNTER — Other Ambulatory Visit: Payer: Self-pay

## 2020-04-13 ENCOUNTER — Other Ambulatory Visit: Payer: Self-pay | Admitting: Family

## 2020-04-13 ENCOUNTER — Other Ambulatory Visit (INDEPENDENT_AMBULATORY_CARE_PROVIDER_SITE_OTHER): Payer: Medicare HMO

## 2020-04-13 DIAGNOSIS — R911 Solitary pulmonary nodule: Secondary | ICD-10-CM

## 2020-04-13 DIAGNOSIS — J189 Pneumonia, unspecified organism: Secondary | ICD-10-CM

## 2020-04-13 DIAGNOSIS — R918 Other nonspecific abnormal finding of lung field: Secondary | ICD-10-CM

## 2020-04-13 DIAGNOSIS — K449 Diaphragmatic hernia without obstruction or gangrene: Secondary | ICD-10-CM | POA: Diagnosis not present

## 2020-04-13 NOTE — Telephone Encounter (Signed)
Noted. If he continues with lower abdominal pain, he will need to make an appointment for next available for further evaluation.

## 2020-04-16 ENCOUNTER — Telehealth: Payer: Self-pay | Admitting: Family

## 2020-04-16 ENCOUNTER — Telehealth: Payer: Self-pay | Admitting: Internal Medicine

## 2020-04-16 NOTE — Telephone Encounter (Signed)
Was urine result from 5-27 reviewed?

## 2020-04-16 NOTE — Telephone Encounter (Signed)
Urine was negative.

## 2020-04-16 NOTE — Telephone Encounter (Signed)
Pt's wife called to speak with AM about husband's condition and what was recommended. Please call [phone].

## 2020-04-16 NOTE — Telephone Encounter (Signed)
Michael Horton,  I made [name] aware that urine results were negative. She c/o of mucous in stool. Told her to keep GI appt on 6/23. She agreed. Then she complained that CT scan on 7/2 is for abdomen and not of pts lung.  Can both scans be done at the same time?  CT chest and CT abdomen?

## 2020-04-16 NOTE — Telephone Encounter (Signed)
See other phone note

## 2020-04-16 NOTE — Telephone Encounter (Signed)
Noted. Pt is having some clear fluid leaking from his buttock and lower abdominal pain. Pt was scheduled with AB on 04/18/20 @ 8:30 AM.

## 2020-04-16 NOTE — Telephone Encounter (Signed)
Michael Horton, can you see if we can get both of these scheduled on the same day?

## 2020-04-16 NOTE — Telephone Encounter (Signed)
Waiting response from provider .

## 2020-04-17 NOTE — Telephone Encounter (Signed)
Waiting on PA for chest from insurance - patient,s daughter notified

## 2020-04-18 ENCOUNTER — Ambulatory Visit (INDEPENDENT_AMBULATORY_CARE_PROVIDER_SITE_OTHER): Payer: Medicare HMO | Admitting: Gastroenterology

## 2020-04-18 ENCOUNTER — Other Ambulatory Visit: Payer: Self-pay

## 2020-04-18 ENCOUNTER — Encounter: Payer: Self-pay | Admitting: Gastroenterology

## 2020-04-18 VITALS — BP 124/73 | HR 80 | Temp 97.5°F | Ht 69.0 in | Wt 159.6 lb

## 2020-04-18 DIAGNOSIS — R109 Unspecified abdominal pain: Secondary | ICD-10-CM

## 2020-04-18 DIAGNOSIS — K643 Fourth degree hemorrhoids: Secondary | ICD-10-CM | POA: Diagnosis not present

## 2020-04-18 DIAGNOSIS — R634 Abnormal weight loss: Secondary | ICD-10-CM | POA: Insufficient documentation

## 2020-04-18 DIAGNOSIS — D509 Iron deficiency anemia, unspecified: Secondary | ICD-10-CM

## 2020-04-18 NOTE — Progress Notes (Signed)
Referring Provider: Sharion Balloon, FNP Primary Care Physician:  Sharion Balloon, FNP Primary GI: Dr. Gala Romney   Chief Complaint  Patient presents with  . lower abd pain    constant, going on for about 2 months. No diarrhea, no constipation  . Nausea    no vomiting  . leakage from rectum    clear and mixed with some stool, mucus like    HPI:   Michael Horton is a 74 y.o. male presenting today with a history of abdominal pain, rectal bleeding, hemorrhoids, nausea, weight loss, with last colonoscopy reportedly 10 years ago in [place]. No known polyps. Initially seen here as new patient in May 2021. EGD had been recommended due to dyspepsia, remote history of PUD in his 22s, and colonoscopy had also been recommended. Michael Horton, wife is present. Since about April, symptoms have been present.   Mild anemia dating back several years (Hgb staying in the 11/12 range). Iron panel consistent with IDA.   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 Repeat CT is scheduled for 7/2 to follow-up on renal findings. CT chest is also ordered. He saw Dr. Donzetta Matters with Vascular Surgery on 6/25 and undergoing endovascular repair 7/15.  He has seen Hematology for renal lesion and CT upcoming.   Has noticed weight loss, adjusting notches on his belt. Noted several weeks ago. Baseline weight 172. From March 2021 has gone from 171 to currently 159.6.  Significant hemorrhoids. Intermittent low-volume hematochezia when hemorrhoids flaring. Can't get the hemorrhoids to go back in currently. For awhile had been going back and forth between constipation and diarrhea. Has mucus draining from rectum. Wearing pads, changing TID. Soft stools  intermittently. Usually first thing in the morning. Not much appetite. While taking iron had been taking dulcolax but no longer taking dulcolax. Lower abdominal pain, LLQ pain at time with sharp pain lower abdomen, cramping and nausea feeling. Feels relieved with BMs. Took dicyclomine three times last week then noted rectal draining. Helped with cramping but was worried about taking. Zofran prn. No prolonged toilet time. Sometimes straining.   No further upper abdominal pain. Taking Prilosec once a day. No dysphagia. Appetite is poor. Activity has decreased. Goes from recliner to bed. Quit smoking in May 2021. Had been a smoker since age 61.   Cough resolved now. Shortness of breath improved. Had also had chest pressure with coughing but this resolved. No fever or chills. No melena.   Past Medical History:  Diagnosis Date  . Anxiety   . Depression   . GERD (gastroesophageal reflux disease)   . Hyperlipidemia     Past Surgical History:  Procedure Laterality Date  . ANKLE FRACTURE SURGERY Right 2017  . COLONOSCOPY     Per patient, this was in [place], no polyps  . HERNIA REPAIR  1984  . SPINE SURGERY  1987   lumbar  . TIBIA FRACTURE SURGERY Right 2017    Current Outpatient Medications  Medication Sig Dispense Refill  . citalopram (CELEXA) 40 MG tablet Take 1 tablet (40 mg total) by mouth daily. (Patient taking differently: Take 20 mg by mouth daily. ) 90 tablet 4  . ferrous sulfate 325 (65 FE) MG tablet Take 325 mg by mouth daily with breakfast.    .  Multiple Vitamin (MULTI VITAMIN DAILY PO) Take by mouth.    . nicotine polacrilex (NICORETTE) 4 MG gum Take 4 mg by mouth as needed for smoking cessation.    Marland Kitchen omeprazole (PRILOSEC) 20 MG capsule Take 1 capsule (20 mg total) by mouth 2 (two) times daily before a meal. (Patient taking differently: Take 20 mg by mouth daily. ) 180 capsule 1  . ondansetron (ZOFRAN) 4 MG tablet Take 1 tablet (4 mg total) by mouth every 8 (eight) hours as needed  for nausea or vomiting. 90 tablet 1  . Pumpkin Seed-Soy Germ (AZO BLADDER CONTROL/GO-LESS PO) Take 1 capsule by mouth in the morning and at bedtime.     . dicyclomine (BENTYL) 20 MG tablet Take 1 tablet (20 mg total) by mouth 4 (four) times daily as needed for spasms. (Patient not taking: Reported on 04/18/2020) 90 tablet 1   No current facility-administered medications for this visit.    Allergies as of 04/18/2020  . (No Known Allergies)    Family History  Problem Relation Age of Onset  . Dementia Mother   . Alzheimer's disease Mother   . Hypertension Mother   . Heart disease Father   . Glaucoma Father   . Lung cancer Paternal Uncle   . Throat cancer Paternal Grandfather   . Heart disease Brother   . Arthritis Brother        back issues   . Lymphoma Sister   . Colon cancer Neg Hx   . Esophageal cancer Neg Hx   . Stomach cancer Neg Hx   . Colon polyps Neg Hx     Social History   Socioeconomic History  . Marital status: Married    Spouse name: [name]  . Number of children: 3  . Years of education: Not on file  . Highest education level: 11th grade  Occupational History  . Occupation: RETIRED  Tobacco Use  . Smoking status: Former Smoker    Packs/day: 0.50    Years: 56.00    Pack years: 28.00    Types: Cigarettes  . Smokeless tobacco: Never Used  . Tobacco comment: quit 03/21/2020  Vaping Use  . Vaping Use: Never used  Substance and Sexual Activity  . Alcohol use: No    Comment: rare  . Drug use: No  . Sexual activity: Yes    Birth control/protection: None  Other Topics Concern  . Not on file  Social History Narrative  . Not on file   Social Determinants of Health   Financial Resource Strain: Low Risk   . Difficulty of Paying Living Expenses: Not hard at all  Food Insecurity: No Food Insecurity  . Worried About Charity fundraiser in the Last Year: Never true  . Ran Out of Food in the Last Year: Never true  Transportation Needs: No Transportation  Needs  . Lack of Transportation (Medical): No  . Lack of Transportation (Non-Medical): No  Physical Activity: Inactive  . Days of Exercise per Week: 0 days  . Minutes of Exercise per Session: 0 min  Stress: Stress Concern Present  . Feeling of Stress : To some extent  Social Connections: Moderately Isolated  . Frequency of Communication with Friends and Family: Once a week  . Frequency of Social Gatherings with Friends and Family: Once a week  . Attends Religious Services: 1 to 4 times per year  . Active Member of Clubs or Organizations: No  . Attends Archivist Meetings: Never  . Marital Status:  Married    Review of Systems: Gen: see HPI CV: Denies chest pain, palpitations, syncope, peripheral edema, and claudication. Resp: Denies dyspnea at rest, cough, wheezing, coughing up blood, and pleurisy. GI: Denies vomiting blood, jaundice, and fecal incontinence.   Denies dysphagia or odynophagia. Derm: Denies rash, itching, dry skin Psych: Denies depression, anxiety, memory loss, confusion. No homicidal or suicidal ideation.  Heme: Denies bruising, bleeding, and enlarged lymph nodes.  Physical Exam: BP 124/73   Pulse 80   Temp (!) 97.5 F (36.4 C) (Oral)   Ht 5\' 9"  (1.753 m)   Wt 159 lb 9.6 oz (72.4 kg)   BMI 23.57 kg/m  General:   Alert and oriented. No distress noted. Pleasant and cooperative.  Head:  Normocephalic and atraumatic. Eyes:  Conjuctiva clear without scleral icterus. Mouth:  Mask in place Lungs: clear bilaterally Abdomen:  +BS, soft, non-tender and non-distended. No rebound or guarding. No HSM or masses noted. Rectal: prolapsing Grade 4 hemorrhoids without thrombosis, unable to reduce. Per patient, this has improved from previously Msk:  Symmetrical without gross deformities. Normal posture. Extremities:  Without edema. Neurologic:  Alert and  oriented x4 Psych:  Alert and cooperative. Normal mood and affect.  ASSESSMENT: KAYDAN WONG is a 74 y.o.  male presenting today with numerous GI concerns including abdominal pain, rectal bleeding, hemorrhoids, weight loss, and IDA, with need to arrange colonoscopy and EGD.   Abdominal pain: CT May 2021 on file without concerning GI findings. Improvement with Bentyl and relief s/p BMs. Component of IBS, doubt occult IBD, unable to exclude malignancy in setting of weight loss. Symptoms not classic for mesenteric ischemia. Remote colonoscopy in [place] 10 years ago.   Rectal bleeding: exam consistent with Grade 4 hemorrhoids. Patient notes these have improved in size since first prolapsing. [facility] cream sent to pharmacy, sitz baths recommended, no straining. Rectal bleeding likely due to these known hemorrhoids.  IDA: colonoscopy/EGD recommended.   Multiple non-GI issues still in process of evaluation: he will be undergoing endovascular repair of abdominal aortic aneurysm 7/15 (6.5 cm). Has seen Hematology for renal lesion. CT chest in future for lung cancer screening.   PLAN:   Proceed with TCS/EGD with Dr. Gala Romney in near future with PROPOFOL: the risks, benefits, and alternatives have been discussed with the patient in detail. The patient states understanding and desires to proceed.  Hold iron 7 days prior  Bentyl 10 mg up to QID before meals. Start off at BID for now. Side effects discussed  Portable sitz bath at home  [place] hemorrhoid cream sent to pharmacy  Continue Prilosec 20 mg BID  Keep follow-up appt with Dr. Constance Haw for hemorrhoids in late July   Annitta Needs, PhD, [facility]

## 2020-04-18 NOTE — Patient Instructions (Addendum)
I have called in the hemorrhoid cream to [facility]. Apply this per rectum 3-4 times per day. They will call you on the cell phone when ready.   Please cut the dicyclomine in half (this will equal 10 milligrams). Take up to 4 times a day for cramping. I would start this just twice a day, as it can have side effects of constipation, dry mouth, dizziness, and confusion. We want to keep with the lowest dosage possible.   I recommend a portable sitz bath to use at home. I have provided a prescription for this to take to [facility] to see if it's readily available. If not, [facility] or [facility] may have.   We are arranging a colonoscopy and upper endoscopy with Dr. Gala Romney in the near future.   It was a pleasure to see you today. I want to create trusting relationships with patients to provide genuine, compassionate, and quality care. I value your feedback. If you receive a survey regarding your visit,  I greatly appreciate you taking time to fill this out.   Annitta Needs, PhD, ANP-BC [facility]           Hemorrhoids Hemorrhoids are swollen veins in and around the rectum or anus. There are two types of hemorrhoids:  Internal hemorrhoids. These occur in the veins that are just inside the rectum. They may poke through to the outside and become irritated and painful.  External hemorrhoids. These occur in the veins that are outside the anus and can be felt as a painful swelling or hard lump near the anus. Most hemorrhoids do not cause serious problems, and they can be managed with home treatments such as diet and lifestyle changes. If home treatments do not help the symptoms, procedures can be done to shrink or remove the hemorrhoids. What are the causes? This condition is caused by increased pressure in the anal area. This pressure may result from various things, including:  Constipation.  Straining to have a bowel  movement.  Diarrhea.  Pregnancy.  Obesity.  Sitting for long periods of time.  Heavy lifting or other activity that causes you to strain.  Anal sex.  Riding a bike for a long period of time. What are the signs or symptoms? Symptoms of this condition include:  Pain.  Anal itching or irritation.  Rectal bleeding.  Leakage of stool (feces).  Anal swelling.  One or more lumps around the anus. How is this diagnosed? This condition can often be diagnosed through a visual exam. Other exams or tests may also be done, such as:  An exam that involves feeling the rectal area with a gloved hand (digital rectal exam).  An exam of the anal canal that is done using a small tube (anoscope).  A blood test, if you have lost a significant amount of blood.  A test to look inside the colon using a flexible tube with a camera on the end (sigmoidoscopy or colonoscopy). How is this treated? This condition can usually be treated at home. However, various procedures may be done if dietary changes, lifestyle changes, and other home treatments do not help your symptoms. These procedures can help make the hemorrhoids smaller or remove them completely. Some of these procedures involve surgery, and others do not. Common procedures include:  Rubber band ligation. Rubber bands are placed at the base of the hemorrhoids to cut off their blood supply.  Sclerotherapy. Medicine is injected into the hemorrhoids to shrink them.  Infrared coagulation. A type  of light energy is used to get rid of the hemorrhoids.  Hemorrhoidectomy surgery. The hemorrhoids are surgically removed, and the veins that supply them are tied off.  Stapled hemorrhoidopexy surgery. The surgeon staples the base of the hemorrhoid to the rectal wall. Follow these instructions at home: Eating and drinking   Eat foods that have a lot of fiber in them, such as whole grains, beans, nuts, fruits, and vegetables.  Ask your health care  provider about taking products that have added fiber (fiber supplements).  Reduce the amount of fat in your diet. You can do this by eating low-fat dairy products, eating less red meat, and avoiding processed foods.  Drink enough fluid to keep your urine pale yellow. Managing pain and swelling   Take warm sitz baths for 20 minutes, 3-4 times a day to ease pain and discomfort. You may do this in a bathtub or using a portable sitz bath that fits over the toilet.  If directed, apply ice to the affected area. Using ice packs between sitz baths may be helpful. ? Put ice in a plastic bag. ? Place a towel between your skin and the bag. ? Leave the ice on for 20 minutes, 2-3 times a day. General instructions  Take over-the-counter and prescription medicines only as told by your health care provider.  Use medicated creams or suppositories as told.  Get regular exercise. Ask your health care provider how much and what kind of exercise is best for you. In general, you should do moderate exercise for at least 30 minutes on most days of the week (150 minutes each week). This can include activities such as walking, biking, or yoga.  Go to the bathroom when you have the urge to have a bowel movement. Do not wait.  Avoid straining to have bowel movements.  Keep the anal area dry and clean. Use wet toilet paper or moist towelettes after a bowel movement.  Do not sit on the toilet for long periods of time. This increases blood pooling and pain.  Keep all follow-up visits as told by your health care provider. This is important. Contact a health care provider if you have:  Increasing pain and swelling that are not controlled by treatment or medicine.  Difficulty having a bowel movement, or you are unable to have a bowel movement.  Pain or inflammation outside the area of the hemorrhoids. Get help right away if you have:  Uncontrolled bleeding from your rectum. Summary  Hemorrhoids are swollen  veins in and around the rectum or anus.  Most hemorrhoids can be managed with home treatments such as diet and lifestyle changes.  Taking warm sitz baths can help ease pain and discomfort.  In severe cases, procedures or surgery can be done to shrink or remove the hemorrhoids. This information is not intended to replace advice given to you by your health care provider. Make sure you discuss any questions you have with your health care provider. Document Revised: 03/11/2019 Document Reviewed: 03/04/2018 Elsevier Patient Education  [place].

## 2020-04-19 ENCOUNTER — Telehealth: Payer: Self-pay

## 2020-04-19 ENCOUNTER — Other Ambulatory Visit: Payer: Self-pay

## 2020-04-19 NOTE — Telephone Encounter (Signed)
Pt's spouse returned call. Please call her back [phone].

## 2020-04-19 NOTE — Telephone Encounter (Signed)
PA for TCS submitted via [employer] website. To be contacted with PA#. Tracking# [id number].  PA for EGD approved. [employer]# [id number], valid 05/03/20-06/02/20.

## 2020-04-19 NOTE — Telephone Encounter (Signed)
Tried to call mobile# (# is for his wife) to schedule TCS/EGD w/Propofol w/RMR, no answer, unable to leave VM d/t mailbox is full.

## 2020-04-19 NOTE — Telephone Encounter (Signed)
Called and informed pt's wife of pre-op/covid test 05/02/20 at 7:30am. Letter mailed with procedure instructions.

## 2020-04-19 NOTE — Telephone Encounter (Signed)
TCS approved. [employer]# [id number], valid 05/03/20-06/02/20.

## 2020-04-20 ENCOUNTER — Ambulatory Visit: Payer: Medicare HMO | Admitting: Vascular Surgery

## 2020-04-20 ENCOUNTER — Other Ambulatory Visit: Payer: Self-pay

## 2020-04-20 ENCOUNTER — Encounter: Payer: Self-pay | Admitting: Vascular Surgery

## 2020-04-20 VITALS — BP 124/80 | HR 71 | Temp 97.5°F | Resp 20 | Ht 69.0 in | Wt 157.0 lb

## 2020-04-20 DIAGNOSIS — I714 Abdominal aortic aneurysm, without rupture, unspecified: Secondary | ICD-10-CM

## 2020-04-20 NOTE — Progress Notes (Signed)
Patient ID: Michael Horton, male   DOB: 12-Oct-1946, 74 y.o.   MRN: [id number]  Reason for Consult: New Patient (Initial Visit)   Referred by Sharion Balloon, FNP  Subjective:     HPI:  Michael Horton is a 74 y.o. male history of.  Does have some back pain no new abdominal pain.  Overall he is progressing well.  has never had a cardiologist.  He walks without limitation.  Just recently quit smoking last month but has been quite compliant with this.  Does not take any blood thinners.  Is never had any major abdominal surgery.  Past Medical History:  Diagnosis Date   AAA (abdominal aortic aneurysm) (HCC)    Anxiety    Depression    GERD (gastroesophageal reflux disease)    Hyperlipidemia    Family History  Problem Relation Age of Onset   Dementia Mother    Alzheimer's disease Mother    Hypertension Mother    Heart disease Father    Glaucoma Father    Lung cancer Paternal Uncle    Throat cancer Paternal Grandfather    Heart disease Brother    Arthritis Brother        back issues    Lymphoma Sister    Colon cancer Neg Hx    Esophageal cancer Neg Hx    Stomach cancer Neg Hx    Colon polyps Neg Hx    Past Surgical History:  Procedure Laterality Date   ANKLE FRACTURE SURGERY Right 2017   COLONOSCOPY     Per patient, this was in [place], no polyps   [place]   lumbar   TIBIA FRACTURE SURGERY Right 2017    Short Social History:  Social History   Tobacco Use   Smoking status: Former Smoker    Packs/day: 0.50    Years: 56.00    Pack years: 28.00    Types: Cigarettes    Quit date: 03/21/2020    Years since quitting: 0.0   Smokeless tobacco: Never Used   Tobacco comment: quit 03/21/2020  Substance Use Topics   Alcohol use: No    Comment: rare    No Known Allergies  Current Outpatient Medications  Medication Sig Dispense Refill   acetaminophen (TYLENOL) 650 MG CR tablet Take 650 mg by mouth every 8  (eight) hours as needed for pain.     citalopram (CELEXA) 40 MG tablet Take 1 tablet (40 mg total) by mouth daily. (Patient taking differently: Take 20 mg by mouth daily. ) 90 tablet 4   dicyclomine (BENTYL) 20 MG tablet Take 1 tablet (20 mg total) by mouth 4 (four) times daily as needed for spasms. (Patient taking differently: Take 10 mg by mouth in the morning and at bedtime. ) 90 tablet 1   diphenhydramine-acetaminophen (TYLENOL PM) 25-500 MG TABS tablet Take 1 tablet by mouth at bedtime as needed (sleep).     docusate sodium (COLACE) 100 MG capsule Take 100 mg by mouth daily as needed for mild constipation.     ferrous sulfate 325 (65 FE) MG tablet Take 325 mg by mouth daily with breakfast.     Multiple Vitamin (MULTI VITAMIN DAILY PO) Take 1 tablet by mouth daily.      nicotine polacrilex (NICORETTE) 4 MG gum Take 4 mg by mouth as needed for smoking cessation.     omeprazole (PRILOSEC) 20 MG capsule Take 1 capsule (20 mg total) by mouth 2 (  two) times daily before a meal. (Patient taking differently: Take 20 mg by mouth daily. ) 180 capsule 1   ondansetron (ZOFRAN) 4 MG tablet Take 1 tablet (4 mg total) by mouth every 8 (eight) hours as needed for nausea or vomiting. 90 tablet 1   PRESCRIPTION MEDICATION Place 1 application rectally daily as needed (hemorrhoids). Hydrocortisone 2.5 % Lidocaine 5% Phenylephrine 0.25% Pramoxine 1% rectal cream     tamsulosin (FLOMAX) 0.4 MG CAPS capsule Take 0.4 mg by mouth daily.     No current facility-administered medications for this visit.    Review of Systems  Constitutional:  Constitutional negative. HENT: HENT negative.  Eyes: Eyes negative.  Respiratory: Respiratory negative.  Cardiovascular: Cardiovascular negative.  GI: Gastrointestinal negative.  Musculoskeletal: Positive for joint pain.  Skin: Skin negative.  Neurological: Neurological negative. Hematologic: Hematologic/lymphatic negative.  Psychiatric: Psychiatric negative.         Objective:  Objective   Vitals:   04/20/20 1205  BP: 124/80  Pulse: 71  Resp: 20  Temp: (!) 97.5 F (36.4 C)  SpO2: 96%  Weight: 157 lb (71.2 kg)  Height: 5\' 9"  (1.753 m)   Body mass index is 23.18 kg/m.  Physical Exam HENT:     Head: Normocephalic.     Nose:     Comments: Mask in place Eyes:     Pupils: Pupils are equal, round, and reactive to light.  Neck:     Vascular: No carotid bruit.  Cardiovascular:     Rate and Rhythm: Normal rate.  Pulmonary:     Effort: Pulmonary effort is normal.  Abdominal:     General: Abdomen is flat.     Palpations: Abdomen is soft. There is no mass.  Musculoskeletal:        General: No swelling. Normal range of motion.  Skin:    General: Skin is warm and dry.     Capillary Refill: Capillary refill takes less than 2 seconds.  Neurological:     General: No focal deficit present.     Mental Status: He is alert.  Psychiatric:        Mood and Affect: Mood normal.        Behavior: Behavior normal.        Thought Content: Thought content normal.        Judgment: Judgment normal.     Data: CT IMPRESSION: 1. 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 No obvious signs of rupture by CT given reported abdominal pain. 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 Aortic Atherosclerosis (ICD10-I70.0). 2. Indistinct, hypodense lesion of the anterior midportion of the left kidney, which may be related to pyelonephritis given left-sided abdominal pain. Correlate with urinalysis. Consider follow-up multiphasic contrast enhanced CT in approximately 4-6 weeks to assess for resolution and to exclude underlying mass. 3. Dense consolidation of the included portion of the left lower lobe with associated trace pleural effusion, concerning for infection or aspiration. 4. Moderate  hiatal hernia, partially imaged, which may place the patient at risk of aspiration. 5. Nonobstructive right nephrolithiasis.  No hydronephrosis.       Assessment/Plan:    74 year old male here with 6.5 cm abdominal aortic aneurysm.  We reviewed the images together with his wife.  We discussed the options being nonoperative versus my recommendation for intervention which would be  endovascular versus open repair.  He does appear to be a candidate for endovascular repair.  We will get him cleared by cardiology and scheduled today and I will review his imaging with our device rep in the very near future and contact him if he is not a candidate.      Waynetta Sandy MD [facility]

## 2020-04-24 ENCOUNTER — Telehealth: Payer: Self-pay | Admitting: Family

## 2020-04-24 ENCOUNTER — Ambulatory Visit: Payer: Medicare HMO | Admitting: Gastroenterology

## 2020-04-24 DIAGNOSIS — R350 Frequency of micturition: Secondary | ICD-10-CM

## 2020-04-24 NOTE — Telephone Encounter (Signed)
Add another bladder medication?

## 2020-04-25 ENCOUNTER — Ambulatory Visit (HOSPITAL_COMMUNITY): Payer: Medicare HMO | Admitting: Clinical

## 2020-04-25 ENCOUNTER — Other Ambulatory Visit: Payer: Self-pay

## 2020-04-25 NOTE — Telephone Encounter (Signed)
Referral to Urologists placed.

## 2020-04-25 NOTE — Telephone Encounter (Signed)
Patients wife aware

## 2020-04-25 NOTE — Progress Notes (Signed)
CC'ED TO PCP 

## 2020-04-26 DIAGNOSIS — R042 Hemoptysis: Secondary | ICD-10-CM

## 2020-04-26 HISTORY — DX: Hemoptysis: R04.2

## 2020-04-27 ENCOUNTER — Other Ambulatory Visit: Payer: Self-pay

## 2020-04-27 ENCOUNTER — Other Ambulatory Visit: Payer: Self-pay | Admitting: Family

## 2020-04-27 ENCOUNTER — Ambulatory Visit (HOSPITAL_COMMUNITY)
Admission: RE | Admit: 2020-04-27 | Discharge: 2020-04-27 | Disposition: A | Discharge status: Home / Self Care | Payer: Medicare HMO | Source: Ambulatory Visit | Attending: Family | Admitting: Family

## 2020-04-27 ENCOUNTER — Inpatient Hospital Stay (HOSPITAL_COMMUNITY): Payer: Medicare HMO | Attending: Hematology

## 2020-04-27 DIAGNOSIS — C7931 Secondary malignant neoplasm of brain: Secondary | ICD-10-CM | POA: Diagnosis not present

## 2020-04-27 DIAGNOSIS — R911 Solitary pulmonary nodule: Secondary | ICD-10-CM

## 2020-04-27 DIAGNOSIS — J9811 Atelectasis: Secondary | ICD-10-CM | POA: Diagnosis present

## 2020-04-27 DIAGNOSIS — R634 Abnormal weight loss: Secondary | ICD-10-CM | POA: Diagnosis present

## 2020-04-27 DIAGNOSIS — E785 Hyperlipidemia, unspecified: Secondary | ICD-10-CM | POA: Diagnosis present

## 2020-04-27 DIAGNOSIS — R918 Other nonspecific abnormal finding of lung field: Secondary | ICD-10-CM | POA: Diagnosis not present

## 2020-04-27 DIAGNOSIS — I700 Atherosclerosis of aorta: Secondary | ICD-10-CM | POA: Diagnosis not present

## 2020-04-27 DIAGNOSIS — N289 Disorder of kidney and ureter, unspecified: Secondary | ICD-10-CM

## 2020-04-27 DIAGNOSIS — Z20822 Contact with and (suspected) exposure to covid-19: Secondary | ICD-10-CM | POA: Diagnosis not present

## 2020-04-27 DIAGNOSIS — C3412 Malignant neoplasm of upper lobe, left bronchus or lung: Secondary | ICD-10-CM | POA: Diagnosis not present

## 2020-04-27 DIAGNOSIS — D509 Iron deficiency anemia, unspecified: Secondary | ICD-10-CM | POA: Diagnosis present

## 2020-04-27 DIAGNOSIS — Z515 Encounter for palliative care: Secondary | ICD-10-CM | POA: Diagnosis not present

## 2020-04-27 DIAGNOSIS — D72823 Leukemoid reaction: Secondary | ICD-10-CM | POA: Diagnosis present

## 2020-04-27 DIAGNOSIS — N12 Tubulo-interstitial nephritis, not specified as acute or chronic: Secondary | ICD-10-CM

## 2020-04-27 DIAGNOSIS — F329 Major depressive disorder, single episode, unspecified: Secondary | ICD-10-CM | POA: Diagnosis present

## 2020-04-27 DIAGNOSIS — E559 Vitamin D deficiency, unspecified: Secondary | ICD-10-CM | POA: Diagnosis not present

## 2020-04-27 DIAGNOSIS — C7800 Secondary malignant neoplasm of unspecified lung: Secondary | ICD-10-CM | POA: Diagnosis not present

## 2020-04-27 DIAGNOSIS — C3402 Malignant neoplasm of left main bronchus: Secondary | ICD-10-CM | POA: Diagnosis not present

## 2020-04-27 DIAGNOSIS — M4854XA Collapsed vertebra, not elsewhere classified, thoracic region, initial encounter for fracture: Secondary | ICD-10-CM | POA: Diagnosis not present

## 2020-04-27 DIAGNOSIS — K449 Diaphragmatic hernia without obstruction or gangrene: Secondary | ICD-10-CM | POA: Diagnosis not present

## 2020-04-27 DIAGNOSIS — Z6822 Body mass index (BMI) 22.0-22.9, adult: Secondary | ICD-10-CM | POA: Diagnosis not present

## 2020-04-27 DIAGNOSIS — K641 Second degree hemorrhoids: Secondary | ICD-10-CM | POA: Diagnosis not present

## 2020-04-27 DIAGNOSIS — I96 Gangrene, not elsewhere classified: Secondary | ICD-10-CM | POA: Diagnosis present

## 2020-04-27 DIAGNOSIS — R531 Weakness: Secondary | ICD-10-CM | POA: Diagnosis not present

## 2020-04-27 DIAGNOSIS — N2889 Other specified disorders of kidney and ureter: Secondary | ICD-10-CM

## 2020-04-27 DIAGNOSIS — F17211 Nicotine dependence, cigarettes, in remission: Secondary | ICD-10-CM | POA: Diagnosis not present

## 2020-04-27 DIAGNOSIS — N400 Enlarged prostate without lower urinary tract symptoms: Secondary | ICD-10-CM | POA: Diagnosis present

## 2020-04-27 DIAGNOSIS — R042 Hemoptysis: Secondary | ICD-10-CM | POA: Diagnosis not present

## 2020-04-27 DIAGNOSIS — C7900 Secondary malignant neoplasm of unspecified kidney and renal pelvis: Secondary | ICD-10-CM | POA: Diagnosis present

## 2020-04-27 DIAGNOSIS — I714 Abdominal aortic aneurysm, without rupture: Secondary | ICD-10-CM | POA: Diagnosis present

## 2020-04-27 DIAGNOSIS — C7971 Secondary malignant neoplasm of right adrenal gland: Secondary | ICD-10-CM | POA: Diagnosis present

## 2020-04-27 DIAGNOSIS — F419 Anxiety disorder, unspecified: Secondary | ICD-10-CM | POA: Diagnosis present

## 2020-04-27 DIAGNOSIS — K219 Gastro-esophageal reflux disease without esophagitis: Secondary | ICD-10-CM | POA: Diagnosis present

## 2020-04-27 DIAGNOSIS — Z66 Do not resuscitate: Secondary | ICD-10-CM | POA: Diagnosis not present

## 2020-04-27 DIAGNOSIS — C3492 Malignant neoplasm of unspecified part of left bronchus or lung: Secondary | ICD-10-CM | POA: Diagnosis not present

## 2020-04-27 DIAGNOSIS — G936 Cerebral edema: Secondary | ICD-10-CM | POA: Diagnosis not present

## 2020-04-27 DIAGNOSIS — E871 Hypo-osmolality and hyponatremia: Secondary | ICD-10-CM | POA: Diagnosis not present

## 2020-04-27 DIAGNOSIS — I712 Thoracic aortic aneurysm, without rupture: Secondary | ICD-10-CM | POA: Diagnosis not present

## 2020-04-27 LAB — RETICULOCYTES
Immature Retic Fract: 19.4 % — ABNORMAL HIGH (ref 2.3–15.9)
RBC.: 4.51 MIL/uL (ref 4.22–5.81)
Retic Count, Absolute: 55 10*3/uL (ref 19.0–186.0)
Retic Ct Pct: 1.2 % (ref 0.4–3.1)

## 2020-04-27 LAB — CBC WITH DIFFERENTIAL/PLATELET
Abs Immature Granulocytes: 0.09 10*3/uL — ABNORMAL HIGH (ref 0.00–0.07)
Basophils Absolute: 0 10*3/uL (ref 0.0–0.1)
Basophils Relative: 0 %
Eosinophils Absolute: 0.1 10*3/uL (ref 0.0–0.5)
Eosinophils Relative: 1 %
HCT: 34.8 % — ABNORMAL LOW (ref 39.0–52.0)
Hemoglobin: 10.5 g/dL — ABNORMAL LOW (ref 13.0–17.0)
Immature Granulocytes: 1 %
Lymphocytes Relative: 12 %
Lymphs Abs: 1.7 10*3/uL (ref 0.7–4.0)
MCH: 23.8 pg — ABNORMAL LOW (ref 26.0–34.0)
MCHC: 30.2 g/dL (ref 30.0–36.0)
MCV: 78.7 fL — ABNORMAL LOW (ref 80.0–100.0)
Monocytes Absolute: 1 10*3/uL (ref 0.1–1.0)
Monocytes Relative: 7 %
Neutro Abs: 12.1 10*3/uL — ABNORMAL HIGH (ref 1.7–7.7)
Neutrophils Relative %: 79 %
Platelets: 522 10*3/uL — ABNORMAL HIGH (ref 150–400)
RBC: 4.42 MIL/uL (ref 4.22–5.81)
RDW: 18.1 % — ABNORMAL HIGH (ref 11.5–15.5)
WBC: 15 10*3/uL — ABNORMAL HIGH (ref 4.0–10.5)
nRBC: 0 % (ref 0.0–0.2)

## 2020-04-27 LAB — LACTATE DEHYDROGENASE: LDH: 146 U/L (ref 98–192)

## 2020-04-27 LAB — IRON AND TIBC
Iron: 14 ug/dL — ABNORMAL LOW (ref 45–182)
Saturation Ratios: 6 % — ABNORMAL LOW (ref 17.9–39.5)
TIBC: 233 ug/dL — ABNORMAL LOW (ref 250–450)
UIBC: 219 ug/dL

## 2020-04-27 LAB — FOLATE: Folate: 16.7 ng/mL (ref >5.9)

## 2020-04-27 LAB — VITAMIN B12: Vitamin B-12: 497 pg/mL (ref 180–914)

## 2020-04-27 LAB — FERRITIN: Ferritin: 121 ng/mL (ref 24–336)

## 2020-04-27 LAB — POCT I-STAT CREATININE: Creatinine, Ser: 1 mg/dL (ref 0.61–1.24)

## 2020-04-27 MED ORDER — IOHEXOL 300 MG/ML  SOLN
100.0000 mL | Freq: Once | INTRAMUSCULAR | Status: AC | PRN
  Administered 2020-04-27: 100 mL via INTRAVENOUS

## 2020-04-27 NOTE — Telephone Encounter (Signed)
Spoke with April from [facility] about this and got verbal approval from Dr Lajuana Ripple for pt to have [place] as recommended. April aware.

## 2020-04-30 ENCOUNTER — Other Ambulatory Visit: Payer: Self-pay

## 2020-04-30 ENCOUNTER — Emergency Department (HOSPITAL_COMMUNITY): Payer: Medicare HMO

## 2020-04-30 ENCOUNTER — Encounter (HOSPITAL_COMMUNITY): Payer: Self-pay | Admitting: *Deleted

## 2020-04-30 ENCOUNTER — Inpatient Hospital Stay (HOSPITAL_COMMUNITY)
Admission: EM | Admit: 2020-04-30 | Discharge: 2020-05-04 | DRG: 166 | Disposition: A | Discharge status: Home / Self Care | Payer: Medicare HMO | Attending: Internal Medicine | Admitting: Internal Medicine

## 2020-04-30 ENCOUNTER — Inpatient Hospital Stay (HOSPITAL_COMMUNITY): Payer: Medicare HMO

## 2020-04-30 DIAGNOSIS — R042 Hemoptysis: Secondary | ICD-10-CM | POA: Diagnosis present

## 2020-04-30 DIAGNOSIS — G936 Cerebral edema: Secondary | ICD-10-CM | POA: Diagnosis not present

## 2020-04-30 DIAGNOSIS — Z66 Do not resuscitate: Secondary | ICD-10-CM | POA: Diagnosis present

## 2020-04-30 DIAGNOSIS — R918 Other nonspecific abnormal finding of lung field: Secondary | ICD-10-CM | POA: Diagnosis not present

## 2020-04-30 DIAGNOSIS — Z820 Family history of epilepsy and other diseases of the nervous system: Secondary | ICD-10-CM

## 2020-04-30 DIAGNOSIS — K219 Gastro-esophageal reflux disease without esophagitis: Secondary | ICD-10-CM | POA: Diagnosis present

## 2020-04-30 DIAGNOSIS — N2889 Other specified disorders of kidney and ureter: Secondary | ICD-10-CM | POA: Diagnosis present

## 2020-04-30 DIAGNOSIS — Z515 Encounter for palliative care: Secondary | ICD-10-CM | POA: Diagnosis not present

## 2020-04-30 DIAGNOSIS — Z79899 Other long term (current) drug therapy: Secondary | ICD-10-CM

## 2020-04-30 DIAGNOSIS — C7900 Secondary malignant neoplasm of unspecified kidney and renal pelvis: Secondary | ICD-10-CM | POA: Diagnosis present

## 2020-04-30 DIAGNOSIS — N400 Enlarged prostate without lower urinary tract symptoms: Secondary | ICD-10-CM | POA: Diagnosis present

## 2020-04-30 DIAGNOSIS — D72823 Leukemoid reaction: Secondary | ICD-10-CM | POA: Diagnosis present

## 2020-04-30 DIAGNOSIS — Z6822 Body mass index (BMI) 22.0-22.9, adult: Secondary | ICD-10-CM

## 2020-04-30 DIAGNOSIS — C7800 Secondary malignant neoplasm of unspecified lung: Secondary | ICD-10-CM | POA: Diagnosis present

## 2020-04-30 DIAGNOSIS — Z87891 Personal history of nicotine dependence: Secondary | ICD-10-CM

## 2020-04-30 DIAGNOSIS — M4854XA Collapsed vertebra, not elsewhere classified, thoracic region, initial encounter for fracture: Secondary | ICD-10-CM | POA: Diagnosis not present

## 2020-04-30 DIAGNOSIS — F329 Major depressive disorder, single episode, unspecified: Secondary | ICD-10-CM | POA: Diagnosis not present

## 2020-04-30 DIAGNOSIS — I714 Abdominal aortic aneurysm, without rupture, unspecified: Secondary | ICD-10-CM

## 2020-04-30 DIAGNOSIS — C3402 Malignant neoplasm of left main bronchus: Secondary | ICD-10-CM | POA: Diagnosis present

## 2020-04-30 DIAGNOSIS — Z20822 Contact with and (suspected) exposure to covid-19: Secondary | ICD-10-CM | POA: Diagnosis present

## 2020-04-30 DIAGNOSIS — R634 Abnormal weight loss: Secondary | ICD-10-CM | POA: Diagnosis present

## 2020-04-30 DIAGNOSIS — F419 Anxiety disorder, unspecified: Secondary | ICD-10-CM | POA: Diagnosis present

## 2020-04-30 DIAGNOSIS — J9811 Atelectasis: Secondary | ICD-10-CM | POA: Diagnosis present

## 2020-04-30 DIAGNOSIS — C7971 Secondary malignant neoplasm of right adrenal gland: Secondary | ICD-10-CM | POA: Diagnosis not present

## 2020-04-30 DIAGNOSIS — Z8261 Family history of arthritis: Secondary | ICD-10-CM

## 2020-04-30 DIAGNOSIS — Z801 Family history of malignant neoplasm of trachea, bronchus and lung: Secondary | ICD-10-CM

## 2020-04-30 DIAGNOSIS — C7931 Secondary malignant neoplasm of brain: Secondary | ICD-10-CM | POA: Diagnosis present

## 2020-04-30 DIAGNOSIS — D509 Iron deficiency anemia, unspecified: Secondary | ICD-10-CM | POA: Diagnosis not present

## 2020-04-30 DIAGNOSIS — R531 Weakness: Secondary | ICD-10-CM | POA: Diagnosis not present

## 2020-04-30 DIAGNOSIS — E785 Hyperlipidemia, unspecified: Secondary | ICD-10-CM | POA: Diagnosis not present

## 2020-04-30 DIAGNOSIS — E861 Hypovolemia: Secondary | ICD-10-CM | POA: Diagnosis not present

## 2020-04-30 DIAGNOSIS — I2510 Atherosclerotic heart disease of native coronary artery without angina pectoris: Secondary | ICD-10-CM | POA: Diagnosis present

## 2020-04-30 DIAGNOSIS — Z8249 Family history of ischemic heart disease and other diseases of the circulatory system: Secondary | ICD-10-CM

## 2020-04-30 DIAGNOSIS — Z818 Family history of other mental and behavioral disorders: Secondary | ICD-10-CM

## 2020-04-30 DIAGNOSIS — E871 Hypo-osmolality and hyponatremia: Secondary | ICD-10-CM | POA: Diagnosis not present

## 2020-04-30 DIAGNOSIS — Z83511 Family history of glaucoma: Secondary | ICD-10-CM

## 2020-04-30 DIAGNOSIS — Z808 Family history of malignant neoplasm of other organs or systems: Secondary | ICD-10-CM

## 2020-04-30 DIAGNOSIS — I96 Gangrene, not elsewhere classified: Secondary | ICD-10-CM | POA: Diagnosis not present

## 2020-04-30 DIAGNOSIS — R590 Localized enlarged lymph nodes: Secondary | ICD-10-CM | POA: Diagnosis present

## 2020-04-30 DIAGNOSIS — Z807 Family history of other malignant neoplasms of lymphoid, hematopoietic and related tissues: Secondary | ICD-10-CM

## 2020-04-30 HISTORY — DX: Malignant (primary) neoplasm, unspecified: C80.1

## 2020-04-30 LAB — COMPREHENSIVE METABOLIC PANEL
ALT: 13 U/L (ref 0–44)
ALT: 14 U/L (ref 0–44)
AST: 12 U/L — ABNORMAL LOW (ref 15–41)
AST: 16 U/L (ref 15–41)
Albumin: 3.2 g/dL — ABNORMAL LOW (ref 3.5–5.0)
Albumin: 2.6 g/dL — ABNORMAL LOW (ref 3.5–5.0)
Alkaline Phosphatase: 75 U/L (ref 38–126)
Alkaline Phosphatase: 73 U/L (ref 38–126)
Anion gap: 13 (ref 5–15)
Anion gap: 12 (ref 5–15)
BUN: 13 mg/dL (ref 8–23)
BUN: 12 mg/dL (ref 8–23)
CO2: 26 mmol/L (ref 22–32)
CO2: 24 mmol/L (ref 22–32)
Calcium: 10.6 mg/dL — ABNORMAL HIGH (ref 8.9–10.3)
Calcium: 9.7 mg/dL (ref 8.9–10.3)
Chloride: 94 mmol/L — ABNORMAL LOW (ref 98–111)
Chloride: 95 mmol/L — ABNORMAL LOW (ref 98–111)
Creatinine, Ser: 0.96 mg/dL (ref 0.61–1.24)
Creatinine, Ser: 1.13 mg/dL (ref 0.61–1.24)
GFR calc Af Amer: >60 mL/min (ref >60)
GFR calc Af Amer: >60 mL/min (ref >60)
GFR calc non Af Amer: >60 mL/min (ref >60)
GFR calc non Af Amer: >60 mL/min (ref >60)
Glucose, Bld: 96 mg/dL (ref 70–99)
Glucose, Bld: 139 mg/dL — ABNORMAL HIGH (ref 70–99)
Potassium: 4.3 mmol/L (ref 3.5–5.1)
Potassium: 3.9 mmol/L (ref 3.5–5.1)
Sodium: 133 mmol/L — ABNORMAL LOW (ref 135–145)
Sodium: 131 mmol/L — ABNORMAL LOW (ref 135–145)
Total Bilirubin: 0.5 mg/dL (ref 0.3–1.2)
Total Bilirubin: 0.4 mg/dL (ref 0.3–1.2)
Total Protein: 7.7 g/dL (ref 6.5–8.1)
Total Protein: 6.7 g/dL (ref 6.5–8.1)

## 2020-04-30 LAB — CBC WITH DIFFERENTIAL/PLATELET
Abs Immature Granulocytes: 0.1 10*3/uL — ABNORMAL HIGH (ref 0.00–0.07)
Basophils Absolute: 0 10*3/uL (ref 0.0–0.1)
Basophils Relative: 0 %
Eosinophils Absolute: 0.1 10*3/uL (ref 0.0–0.5)
Eosinophils Relative: 0 %
HCT: 32.7 % — ABNORMAL LOW (ref 39.0–52.0)
Hemoglobin: 9.7 g/dL — ABNORMAL LOW (ref 13.0–17.0)
Immature Granulocytes: 1 %
Lymphocytes Relative: 11 %
Lymphs Abs: 1.5 10*3/uL (ref 0.7–4.0)
MCH: 23.4 pg — ABNORMAL LOW (ref 26.0–34.0)
MCHC: 29.7 g/dL — ABNORMAL LOW (ref 30.0–36.0)
MCV: 79 fL — ABNORMAL LOW (ref 80.0–100.0)
Monocytes Absolute: 0.9 10*3/uL (ref 0.1–1.0)
Monocytes Relative: 6 %
Neutro Abs: 11 10*3/uL — ABNORMAL HIGH (ref 1.7–7.7)
Neutrophils Relative %: 82 %
Platelets: 490 10*3/uL — ABNORMAL HIGH (ref 150–400)
RBC: 4.14 MIL/uL — ABNORMAL LOW (ref 4.22–5.81)
RDW: 18.2 % — ABNORMAL HIGH (ref 11.5–15.5)
WBC: 13.5 10*3/uL — ABNORMAL HIGH (ref 4.0–10.5)
nRBC: 0 % (ref 0.0–0.2)

## 2020-04-30 LAB — PROTIME-INR
INR: 1 (ref 0.8–1.2)
INR: 1 (ref 0.8–1.2)
Prothrombin Time: 12.6 seconds (ref 11.4–15.2)
Prothrombin Time: 13.1 seconds (ref 11.4–15.2)

## 2020-04-30 LAB — URINALYSIS, ROUTINE W REFLEX MICROSCOPIC
Bacteria, UA: NONE SEEN
Bilirubin Urine: NEGATIVE
Glucose, UA: NEGATIVE mg/dL
Hgb urine dipstick: NEGATIVE
Ketones, ur: NEGATIVE mg/dL
Nitrite: NEGATIVE
Protein, ur: NEGATIVE mg/dL
Specific Gravity, Urine: 1.012 (ref 1.005–1.030)
pH: 7 (ref 5.0–8.0)

## 2020-04-30 LAB — TROPONIN I (HIGH SENSITIVITY)
Troponin I (High Sensitivity): 4 ng/L (ref <18)
Troponin I (High Sensitivity): 4 ng/L (ref <18)

## 2020-04-30 LAB — ABO/RH: ABO/RH(D): O POS

## 2020-04-30 LAB — CBC
HCT: 30.3 % — ABNORMAL LOW (ref 39.0–52.0)
Hemoglobin: 9.3 g/dL — ABNORMAL LOW (ref 13.0–17.0)
MCH: 24 pg — ABNORMAL LOW (ref 26.0–34.0)
MCHC: 30.7 g/dL (ref 30.0–36.0)
MCV: 78.1 fL — ABNORMAL LOW (ref 80.0–100.0)
Platelets: 462 10*3/uL — ABNORMAL HIGH (ref 150–400)
RBC: 3.88 MIL/uL — ABNORMAL LOW (ref 4.22–5.81)
RDW: 18.2 % — ABNORMAL HIGH (ref 11.5–15.5)
WBC: 14.4 10*3/uL — ABNORMAL HIGH (ref 4.0–10.5)
nRBC: 0 % (ref 0.0–0.2)

## 2020-04-30 LAB — TYPE AND SCREEN
ABO/RH(D): O POS
Antibody Screen: NEGATIVE

## 2020-04-30 LAB — COPPER, SERUM: Copper: 151 ug/dL — ABNORMAL HIGH (ref 69–132)

## 2020-04-30 LAB — SARS CORONAVIRUS 2 BY RT PCR (HOSPITAL ORDER, PERFORMED IN ~~LOC~~ HOSPITAL LAB): SARS Coronavirus 2: NEGATIVE

## 2020-04-30 LAB — APTT: aPTT: 38 seconds — ABNORMAL HIGH (ref 24–36)

## 2020-04-30 MED ORDER — MUPIROCIN 2 % EX OINT
1.0000 "application " | TOPICAL_OINTMENT | Freq: Two times a day (BID) | CUTANEOUS | Status: DC
  Administered 2020-05-01 – 2020-05-04 (×7): 1 via NASAL
  Filled 2020-04-30 (×4): qty 22

## 2020-04-30 MED ORDER — TAMSULOSIN HCL 0.4 MG PO CAPS
0.4000 mg | ORAL_CAPSULE | Freq: Every day | ORAL | Status: DC
  Administered 2020-04-30 – 2020-05-04 (×4): 0.4 mg via ORAL
  Filled 2020-04-30 (×5): qty 1

## 2020-04-30 MED ORDER — ACETAMINOPHEN 325 MG PO TABS
650.0000 mg | ORAL_TABLET | Freq: Four times a day (QID) | ORAL | Status: DC | PRN
  Administered 2020-04-30 – 2020-05-03 (×4): 650 mg via ORAL
  Filled 2020-04-30 (×6): qty 2

## 2020-04-30 MED ORDER — GADOBUTROL 1 MMOL/ML IV SOLN
7.0000 mL | Freq: Once | INTRAVENOUS | Status: AC | PRN
  Administered 2020-04-30: 7 mL via INTRAVENOUS

## 2020-04-30 MED ORDER — CITALOPRAM HYDROBROMIDE 20 MG PO TABS
20.0000 mg | ORAL_TABLET | Freq: Every day | ORAL | Status: DC
  Administered 2020-05-01 – 2020-05-04 (×4): 20 mg via ORAL
  Filled 2020-04-30 (×4): qty 1

## 2020-04-30 MED ORDER — DICYCLOMINE HCL 10 MG PO CAPS: 20.0000 mg | ORAL_CAPSULE | Freq: Four times a day (QID) | ORAL | Status: DC | PRN

## 2020-04-30 MED ORDER — NICOTINE POLACRILEX 2 MG MT GUM: 4.0000 mg | CHEWING_GUM | OROMUCOSAL | Status: DC | PRN

## 2020-04-30 MED ORDER — FERROUS SULFATE 325 (65 FE) MG PO TABS
325.0000 mg | ORAL_TABLET | Freq: Every day | ORAL | Status: DC
  Administered 2020-05-01 – 2020-05-04 (×4): 325 mg via ORAL
  Filled 2020-04-30 (×4): qty 1

## 2020-04-30 MED ORDER — ACETAMINOPHEN 650 MG RE SUPP: 650.0000 mg | Freq: Four times a day (QID) | RECTAL | Status: DC | PRN

## 2020-04-30 MED ORDER — SODIUM CHLORIDE 0.9% FLUSH
3.0000 mL | INTRAVENOUS | Status: DC | PRN
  Administered 2020-05-01: 3 mL via INTRAVENOUS

## 2020-04-30 MED ORDER — SODIUM CHLORIDE 0.9 % IV SOLN: 250.0000 mL | INTRAVENOUS | Status: DC | PRN

## 2020-04-30 MED ORDER — ONDANSETRON HCL 4 MG PO TABS: 4.0000 mg | ORAL_TABLET | Freq: Four times a day (QID) | ORAL | Status: DC | PRN

## 2020-04-30 MED ORDER — ONDANSETRON HCL 4 MG/2ML IJ SOLN: 4.0000 mg | Freq: Four times a day (QID) | INTRAMUSCULAR | Status: DC | PRN

## 2020-04-30 MED ORDER — SODIUM CHLORIDE 0.9% FLUSH
3.0000 mL | Freq: Two times a day (BID) | INTRAVENOUS | Status: DC
  Administered 2020-04-30 – 2020-05-01 (×3): 3 mL via INTRAVENOUS

## 2020-04-30 MED ORDER — DIPHENHYDRAMINE HCL 25 MG PO CAPS
25.0000 mg | ORAL_CAPSULE | Freq: Every evening | ORAL | Status: DC | PRN
  Administered 2020-05-02: 25 mg via ORAL
  Filled 2020-04-30: qty 1

## 2020-04-30 MED ORDER — DOCUSATE SODIUM 100 MG PO CAPS: 100.0000 mg | ORAL_CAPSULE | Freq: Every day | ORAL | Status: DC | PRN

## 2020-04-30 MED ORDER — PANTOPRAZOLE SODIUM 40 MG IV SOLR
40.0000 mg | INTRAVENOUS | Status: DC
  Administered 2020-04-30: 40 mg via INTRAVENOUS
  Filled 2020-04-30: qty 40

## 2020-04-30 NOTE — Plan of Care (Signed)
  Problem: Education: Goal: Knowledge of General Education information will improve Description Including pain rating scale, medication(s)/side effects and non-pharmacologic comfort measures Outcome: Progressing   

## 2020-04-30 NOTE — ED Triage Notes (Signed)
Schedule for repair of AAA on the 15th of July

## 2020-04-30 NOTE — Progress Notes (Signed)
Dr. Wyline Copas and Dr. Tamala Julian aware of pt arrival to [phone].

## 2020-04-30 NOTE — ED Provider Notes (Signed)
[facility] EMERGENCY DEPARTMENT Provider Note   CSN: [id number] Arrival date & time: 04/30/20  1039     History Chief Complaint  Patient presents with   Hemoptysis    Michael Horton is a 74 y.o. male.  Pt presents to the ED today with coughing up blood.  The pt said he's been coughing up blood for about 1 week.  He denies any pain.  No f/c.  Pt is scheduled for an abdominal aortic endovascular stent to be placed by Dr. Donzetta Matters on 7/15.  His PCP ordered a CT chest/abdomen/pelvis which was done on 7/2 as a follow up for a pyelonephritis he had.  He has not been called about the results.  This morning, he said he was coughing up clots.        Past Medical History:  Diagnosis Date   AAA (abdominal aortic aneurysm) (HCC)    Anxiety    Depression    GERD (gastroesophageal reflux disease)    Hyperlipidemia     Patient Active Problem List   Diagnosis Date Noted   Prolapsed internal hemorrhoids, grade 4 04/18/2020   Loss of weight 04/18/2020   Left kidney mass 04/02/2020   Aortic aneurysm, abdominal ([place]) 03/22/2020   Abdominal pain 03/19/2020   Nausea without vomiting 03/19/2020   Microcytic anemia 03/19/2020   Constipation 03/19/2020   Rectal bleeding 03/15/2020   Grade II hemorrhoids 03/15/2020   Vitamin D deficiency 01/06/2017   Hypertriglyceridemia 01/06/2017   GERD (gastroesophageal reflux disease) 01/02/2017   Smoking greater than 40 pack years 01/02/2017   Generalized anxiety disorder 01/01/2014    Past Surgical History:  Procedure Laterality Date   ANKLE FRACTURE SURGERY Right 2017   COLONOSCOPY     Per patient, this was in [place], no polyps   [place]   lumbar   TIBIA FRACTURE SURGERY Right 2017       Family History  Problem Relation Age of Onset   Dementia Mother    Alzheimer's disease Mother    Hypertension Mother    Heart disease Father    Glaucoma Father    Lung cancer Paternal  Uncle    Throat cancer Paternal Grandfather    Heart disease Brother    Arthritis Brother        back issues    Lymphoma Sister    Colon cancer Neg Hx    Esophageal cancer Neg Hx    Stomach cancer Neg Hx    Colon polyps Neg Hx     Social History   Tobacco Use   Smoking status: Former Smoker    Packs/day: 0.50    Years: 56.00    Pack years: 28.00    Types: Cigarettes    Quit date: 03/21/2020    Years since quitting: 0.1   Smokeless tobacco: Never Used   Tobacco comment: quit 03/21/2020  Vaping Use   Vaping Use: Never used  Substance Use Topics   Alcohol use: No    Comment: rare   Drug use: No    Home Medications Prior to Admission medications   Medication Sig Start Date End Date Taking? Authorizing Provider  acetaminophen (TYLENOL) 650 MG CR tablet Take 650 mg by mouth every 8 (eight) hours as needed for pain.   Yes [provider]  citalopram (CELEXA) 40 MG tablet Take 1 tablet (40 mg total) by mouth daily. Patient taking differently: Take 20 mg by mouth daily.  01/13/20  Yes  Hawks, Christy A, FNP  dicyclomine (BENTYL) 20 MG tablet Take 1 tablet (20 mg total) by mouth 4 (four) times daily as needed for spasms. Patient taking differently: Take 10 mg by mouth in the morning and at bedtime.  04/10/20  Yes Hawks, Christy A, FNP  diphenhydramine-acetaminophen (TYLENOL PM) 25-500 MG TABS tablet Take 1 tablet by mouth at bedtime as needed (sleep).   Yes [provider]  docusate sodium (COLACE) 100 MG capsule Take 100 mg by mouth daily as needed for mild constipation.   Yes [provider]  Ensure (ENSURE) Take 1 Can by mouth daily.   Yes [provider]  ferrous sulfate 325 (65 FE) MG tablet Take 325 mg by mouth daily with breakfast.   Yes [provider]  Misc Natural Products ([place]) Take 2 capsules by mouth daily.   Yes [provider]  Multiple Vitamin (MULTI VITAMIN DAILY PO) Take 1  tablet by mouth daily.    Yes [provider]  nicotine polacrilex (NICORETTE) 4 MG gum Take 4 mg by mouth as needed for smoking cessation.   Yes [provider]  omeprazole (PRILOSEC) 20 MG capsule Take 1 capsule (20 mg total) by mouth 2 (two) times daily before a meal. Patient taking differently: Take 20 mg by mouth daily.  03/01/20  Yes Hawks, Christy A, FNP  ondansetron (ZOFRAN) 4 MG tablet Take 1 tablet (4 mg total) by mouth every 8 (eight) hours as needed for nausea or vomiting. 04/10/20  Yes Sharion Balloon, FNP  PRESCRIPTION MEDICATION Place 1 application rectally daily as needed (hemorrhoids). Hydrocortisone 2.5 % Lidocaine 5% Phenylephrine 0.25% Pramoxine 1% rectal cream   Yes [provider]  tamsulosin (FLOMAX) 0.4 MG CAPS capsule Take 0.4 mg by mouth daily. Patient not taking: Reported on 04/30/2020    [provider]    Allergies    Patient has no known allergies.  Review of Systems   Review of Systems  Respiratory:       Hemoptysis  All other systems reviewed and are negative.   Physical Exam Updated Vital Signs BP 138/85    Pulse 73    Temp 98.4 F (36.9 C) (Oral)    Resp (!) 23    Ht 5\' 9"  (1.753 m)    Wt 72.1 kg    SpO2 99%    BMI 23.48 kg/m   Physical Exam Vitals and nursing note reviewed.  Constitutional:      Appearance: Normal appearance.  HENT:     Head: Normocephalic and atraumatic.     Right Ear: External ear normal.     Left Ear: External ear normal.     Nose: Nose normal.     Mouth/Throat:     Mouth: Mucous membranes are moist.     Pharynx: Oropharynx is clear.  Eyes:     Extraocular Movements: Extraocular movements intact.     Conjunctiva/sclera: Conjunctivae normal.     Pupils: Pupils are equal, round, and reactive to light.  Cardiovascular:     Rate and Rhythm: Normal rate and regular rhythm.     Pulses: Normal pulses.     Heart sounds: Normal heart sounds.  Pulmonary:     Effort: Pulmonary effort is normal.       Breath sounds: Normal breath sounds.  Abdominal:     General: Abdomen is flat. Bowel sounds are normal.     Palpations: Abdomen is soft.  Musculoskeletal:  General: Normal range of motion.     Cervical back: Normal range of motion and neck supple.  Skin:    General: Skin is warm.     Capillary Refill: Capillary refill takes less than 2 seconds.  Neurological:     General: No focal deficit present.     Mental Status: He is alert and oriented to person, place, and time.  Psychiatric:        Mood and Affect: Mood normal.        Behavior: Behavior normal.        Thought Content: Thought content normal.        Judgment: Judgment normal.     ED Results / Procedures / Treatments   Labs (all labs ordered are listed, but only abnormal results are displayed) Labs Reviewed  COMPREHENSIVE METABOLIC PANEL - Abnormal; Notable for the following components:      Result Value   Sodium 133 (*)    Chloride 94 (*)    Calcium 10.6 (*)    Albumin 3.2 (*)    AST 12 (*)    All other components within normal limits  CBC WITH DIFFERENTIAL/PLATELET - Abnormal; Notable for the following components:   WBC 13.5 (*)    RBC 4.14 (*)    Hemoglobin 9.7 (*)    HCT 32.7 (*)    MCV 79.0 (*)    MCH 23.4 (*)    MCHC 29.7 (*)    RDW 18.2 (*)    Platelets 490 (*)    Neutro Abs 11.0 (*)    Abs Immature Granulocytes 0.10 (*)    All other components within normal limits  SARS CORONAVIRUS 2 BY RT PCR (HOSPITAL ORDER, [place] [facility])  PROTIME-INR  URINALYSIS, ROUTINE W REFLEX MICROSCOPIC  TROPONIN I (HIGH SENSITIVITY)  TROPONIN I (HIGH SENSITIVITY)    EKG EKG Interpretation  Date/Time:  Monday April 30 2020 11:42:03 EDT Ventricular Rate:  66 PR Interval:    QRS Duration: 97 QT Interval:  414 QTC Calculation: 434 R Axis:   12 Text Interpretation: Sinus rhythm No old tracing to compare Confirmed by Isla Pence [phone]) on 04/30/2020 2:05:09 PM   Radiology DG Chest  2 View  Result Date: 04/30/2020 CLINICAL DATA:  Hemoptysis for 1 week EXAM: CHEST - 2 VIEW COMPARISON:  Chest x-ray from April 13, 2020, CT chest from April 27, 2020 FINDINGS: Added density along the LEFT lower lobe, superior segment partially visualized on abdominal CT showing no change and undulating borders on the lateral view. Cardiomediastinal contours are otherwise stable. Increased density in the retrocardiac region also likely due in part to hiatal hernia. Signs of compression fracture at T12 similar to prior imaging. RIGHT lung remains clear. IMPRESSION: 1. Stable chest.  Without acute changes since April 13, 2020. 2. Known mass at the LEFT hilum with associated volume loss is similar to the prior study 3. Stable hiatal hernia. 4. Signs of compression fracture in the lower thoracic spine as before. Electronically Signed   By: Zetta Bills M.D.   On: 04/30/2020 13:08     IMPRESSION: 1. There is a large, heterogeneously hyperdense left hilar mass, measuring approximately 8.2 cm. There is obstruction of the left lower lobe bronchus and total atelectasis of the left lower lobe. 2. Enlarged subcarinal and bilateral hilar lymph nodes, suspicious for nodal metastatic disease. 3. There has been a significant increase in infiltrative appearing, hypodense lesion of the left kidney, which now occupies most of the kidney  and measures approximately 8.9 cm. There is an additional lesion of the superior pole of the right kidney, which is now much more clearly appreciated than subtle appearance on prior examination, measuring 5.3 cm. 4. 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 No other evidence of distant metastatic disease is appreciated. 5. 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 Recommend surgical  consultation. Aortic aneurysm NOS (ICD10-I71.9). Aortic Atherosclerosis (ICD10-I70.0). 6. Emphysema (ICD10-J43.9). 7. Coronary artery disease.  Procedures Procedures (including critical care time)  Medications Ordered in ED Medications - No data to display  ED Course  I have reviewed the triage vital signs and the nursing notes.  Pertinent labs & imaging results that were available during my care of the patient were reviewed by me and considered in my medical decision making (see chart for details).    MDM Rules/Calculators/A&P                         I reviewed the CT scan.  Unfortunately, it shows a probable primary lung cancer with mets.  Known aneurysm has not grown.    Pt d/w Dr. Erskine Emery (CCM).  He recommended admission to [facility] by the hospitalists and a bronchoscopy likely tomorrow.  He requests a call when pt arrives to [facility].  Pt d/w Dr. Manuella Ghazi ([facility]) for admission.   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

## 2020-04-30 NOTE — H&P (Addendum)
History and Physical    Michael Horton YQI:[id number] DOB: [birth year] DOA: 04/30/2020  PCP: Sharion Balloon, FNP   Patient coming from: Home  Chief Complaint: Hemoptysis  HPI: Michael Horton is a 74 y.o. male with medical history significant for infrarenal AAA, anxiety/depression, prior tobacco use, GERD, iron deficiency anemia, recent pyelonephritis, and BPH who presented to the ED after having coughed up dark blood with some clots over the last 1 week.  He denies any chest pain, sore throat, epistaxis, or any other issues.  He does not appear to be on blood thinners.  He is scheduled for an abdominal aortic endovascular stent to be placed by Dr. Magdalene Molly on 7/15 as well.  He has had recent pyelonephritis that was treated by his PCP and has had a recent CT chest/abdomen/pelvis performed demonstrating a significant left sided hilar mass as well as left and right kidney mass.  He denies any fevers or chills or sick contacts.  He has apparently lost approximately 15 pounds unintentionally in the last several weeks.   ED Course: Stable vital signs noted and leukocytosis of 13,500 noted as well as sodium 133.  Hemoglobin is 9.7 compared to approximately 12, 1 month ago.  2 view chest x-ray with finding of large left hilar mass and noted chronic thoracic compression fracture.  Covid testing pending.  Review of Systems: All others reviewed as noted above and otherwise negative.  Past Medical History:  Diagnosis Date  . AAA (abdominal aortic aneurysm) ([place])   . Anxiety   . Depression   . GERD (gastroesophageal reflux disease)   . Hyperlipidemia     Past Surgical History:  Procedure Laterality Date  . ANKLE FRACTURE SURGERY Right 2017  . COLONOSCOPY     Per patient, this was in [place], no polyps  . HERNIA REPAIR  1984  . SPINE SURGERY  1987   lumbar  . TIBIA FRACTURE SURGERY Right 2017     reports that he quit smoking about 5 weeks ago. His smoking use included cigarettes. He has a 28.00  pack-year smoking history. He has never used smokeless tobacco. He reports that he does not drink alcohol and does not use drugs.  No Known Allergies  Family History  Problem Relation Age of Onset  . Dementia Mother   . Alzheimer's disease Mother   . Hypertension Mother   . Heart disease Father   . Glaucoma Father   . Lung cancer Paternal Uncle   . Throat cancer Paternal Grandfather   . Heart disease Brother   . Arthritis Brother        back issues   . Lymphoma Sister   . Colon cancer Neg Hx   . Esophageal cancer Neg Hx   . Stomach cancer Neg Hx   . Colon polyps Neg Hx     Prior to Admission medications   Medication Sig Start Date End Date Taking? Authorizing Provider  acetaminophen (TYLENOL) 650 MG CR tablet Take 650 mg by mouth every 8 (eight) hours as needed for pain.   Yes [provider]  citalopram (CELEXA) 40 MG tablet Take 1 tablet (40 mg total) by mouth daily. Patient taking differently: Take 20 mg by mouth daily.  01/13/20  Yes Hawks, Christy A, FNP  dicyclomine (BENTYL) 20 MG tablet Take 1 tablet (20 mg total) by mouth 4 (four) times daily as needed for spasms. Patient taking differently: Take 10 mg by mouth in the morning and at bedtime.  04/10/20  Yes  Hawks, Christy A, FNP  diphenhydramine-acetaminophen (TYLENOL PM) 25-500 MG TABS tablet Take 1 tablet by mouth at bedtime as needed (sleep).   Yes [provider]  docusate sodium (COLACE) 100 MG capsule Take 100 mg by mouth daily as needed for mild constipation.   Yes [provider]  Ensure (ENSURE) Take 1 Can by mouth daily.   Yes [provider]  ferrous sulfate 325 (65 FE) MG tablet Take 325 mg by mouth daily with breakfast.   Yes [provider]  Misc Natural Products ([place]) Take 2 capsules by mouth daily.   Yes [provider]  Multiple Vitamin (MULTI VITAMIN DAILY PO) Take 1 tablet by mouth daily.    Yes [provider]    nicotine polacrilex (NICORETTE) 4 MG gum Take 4 mg by mouth as needed for smoking cessation.   Yes [provider]  omeprazole (PRILOSEC) 20 MG capsule Take 1 capsule (20 mg total) by mouth 2 (two) times daily before a meal. Patient taking differently: Take 20 mg by mouth daily.  03/01/20  Yes Hawks, Christy A, FNP  ondansetron (ZOFRAN) 4 MG tablet Take 1 tablet (4 mg total) by mouth every 8 (eight) hours as needed for nausea or vomiting. 04/10/20  Yes Sharion Balloon, FNP  PRESCRIPTION MEDICATION Place 1 application rectally daily as needed (hemorrhoids). Hydrocortisone 2.5 % Lidocaine 5% Phenylephrine 0.25% Pramoxine 1% rectal cream   Yes [provider]  tamsulosin (FLOMAX) 0.4 MG CAPS capsule Take 0.4 mg by mouth daily. Patient not taking: Reported on 04/30/2020    [provider]    Physical Exam: Vitals:   04/30/20 1126 04/30/20 1337  BP: 140/90 138/85  Pulse: 73 73  Resp: 18 (!) 23  Temp: 98.4 F (36.9 C)   TempSrc: Oral   SpO2: 99% 99%  Weight: 72.1 kg   Height: 5\' 9"  (1.753 m)     Constitutional: NAD, calm, comfortable Vitals:   04/30/20 1126 04/30/20 1337  BP: 140/90 138/85  Pulse: 73 73  Resp: 18 (!) 23  Temp: 98.4 F (36.9 C)   TempSrc: Oral   SpO2: 99% 99%  Weight: 72.1 kg   Height: 5\' 9"  (1.753 m)    Eyes: lids and conjunctivae normal ENMT: Mucous membranes are moist.  Neck: normal, supple Respiratory: clear to auscultation bilaterally. Normal respiratory effort. No accessory muscle use.  Cardiovascular: Regular rate and rhythm, no murmurs. No extremity edema. Abdomen: no tenderness, no distention. Bowel sounds positive.  Musculoskeletal:  No joint deformity upper and lower extremities.   Skin: no rashes, lesions, ulcers.  Psychiatric: Normal judgment and insight. Alert and oriented x 3. Normal mood.   Labs on Admission: I have personally reviewed following labs and imaging studies  CBC: Recent Labs  Lab 04/27/20 0852  04/30/20 1153  WBC 15.0* 13.5*  NEUTROABS 12.1* 11.0*  HGB 10.5* 9.7*  HCT 34.8* 32.7*  MCV 78.7* 79.0*  PLT 522* 161*   Basic Metabolic Panel: Recent Labs  Lab 04/27/20 1033 04/30/20 1153  NA  --  133*  K  --  4.3  CL  --  94*  CO2  --  26  GLUCOSE  --  96  BUN  --  13  CREATININE 1.00 0.96  CALCIUM  --  10.6*   GFR: Estimated Creatinine Clearance: 68.5 mL/min (by C-G formula based on SCr of 0.96 mg/dL). Liver Function Tests: Recent Labs  Lab 04/30/20 1153  AST 12*  ALT 13  ALKPHOS 75  BILITOT 0.5  PROT 7.7  ALBUMIN 3.2*   No results for input(s): LIPASE, AMYLASE in the last 168 hours. No results for input(s): AMMONIA in the last 168 hours. Coagulation Profile: Recent Labs  Lab 04/30/20 1153  INR 1.0   Cardiac Enzymes: No results for input(s): CKTOTAL, CKMB, CKMBINDEX, TROPONINI in the last 168 hours. BNP (last 3 results) No results for input(s): PROBNP in the last 8760 hours. HbA1C: No results for input(s): HGBA1C in the last 72 hours. CBG: No results for input(s): GLUCAP in the last 168 hours. Lipid Profile: No results for input(s): CHOL, HDL, LDLCALC, TRIG, CHOLHDL, LDLDIRECT in the last 72 hours. Thyroid Function Tests: No results for input(s): TSH, T4TOTAL, FREET4, T3FREE, THYROIDAB in the last 72 hours. Anemia Panel: No results for input(s): VITAMINB12, FOLATE, FERRITIN, TIBC, IRON, RETICCTPCT in the last 72 hours. Urine analysis:    Component Value Date/Time   COLORURINE YELLOW 04/30/2020 1137   APPEARANCEUR CLEAR 04/30/2020 1137   APPEARANCEUR Clear 03/22/2020 1028   LABSPEC 1.012 04/30/2020 1137   PHURINE 7.0 04/30/2020 1137   GLUCOSEU NEGATIVE 04/30/2020 1137   HGBUR NEGATIVE 04/30/2020 1137   BILIRUBINUR NEGATIVE 04/30/2020 1137   BILIRUBINUR Negative 03/22/2020 [place] 04/30/2020 1137   PROTEINUR NEGATIVE 04/30/2020 1137   NITRITE NEGATIVE 04/30/2020 1137   LEUKOCYTESUR TRACE (A) 04/30/2020 1137    Radiological  Exams on Admission: DG Chest 2 View  Result Date: 04/30/2020 CLINICAL DATA:  Hemoptysis for 1 week EXAM: CHEST - 2 VIEW COMPARISON:  Chest x-ray from April 13, 2020, CT chest from April 27, 2020 FINDINGS: Added density along the LEFT lower lobe, superior segment partially visualized on abdominal CT showing no change and undulating borders on the lateral view. Cardiomediastinal contours are otherwise stable. Increased density in the retrocardiac region also likely due in part to hiatal hernia. Signs of compression fracture at T12 similar to prior imaging. RIGHT lung remains clear. IMPRESSION: 1. Stable chest.  Without acute changes since April 13, 2020. 2. Known mass at the LEFT hilum with associated volume loss is similar to the prior study 3. Stable hiatal hernia. 4. Signs of compression fracture in the lower thoracic spine as before. Electronically Signed   By: Zetta Bills M.D.   On: 04/30/2020 13:08    EKG: Independently reviewed. SR 66bpm.  Assessment/Plan Active Problems:   Hemoptysis    Hemoptysis in the setting of large left hilar mass -In the setting of significant tobacco abuse -EDP has discussed with pulmonology Dr. Tamala Julian who recommends transfer to [facility] for bronchoscopy with biopsy -Please let Dr. Tamala Julian know after arrival -N.p.o. after midnight  Acute on chronic iron deficiency anemia secondary to above -Transfuse for hemoglobin less than 7 -Monitor with repeat CBC in a.m. -Remain off heparin agents -Continue iron supplementation  Bilateral kidney masses -Will likely require urology evaluation in outpatient setting -Creatinine at baseline currently  Infrarenal AAA -Plan for endovascular stent with Dr. Magdalene Molly on 7/15  Anxiety/depression -Continue citalopram  GERD -Maintain on IV PPI daily  History of tobacco abuse -Patient states that he has stopped smoking -Declines nicotine patch  History of BPH -Continue Flomax  DVT prophylaxis: SCDs Code Status:  DNR Family Communication: Wife at bedside Disposition Plan:Transfer to [facility] for bronchoscopy Consults called:EDP spoke with Pulmonology Dr. Tamala Julian Admission status:   Status is: Inpatient  Remains inpatient appropriate because:Ongoing diagnostic testing needed not appropriate for outpatient work up   Dispo: The patient is from: Home  Anticipated d/c is to: Home              Anticipated d/c date is: 2 days              Patient currently is not medically stable to d/c.  Michael Horton D Manuella Ghazi DO [facility]  If 7PM-7AM, please contact night-coverage [email or web address]  04/30/2020, 2:41 PM

## 2020-04-30 NOTE — Consult Note (Signed)
NAME:  Michael Horton, MRN:  [id number], DOB:  November 16, 1945, LOS: 0 ADMISSION DATE:  04/30/2020, CONSULTATION DATE:  04/30/20 REFERRING MD:  [name] - EM, CHIEF COMPLAINT:  Hemoptysis  Brief History   74 yo M w recent finding of L hilar mass, presents to [facility] with worsening hemoptysis x 1 week.  Transferring to [facility] where Pulmonary will consult and likely pursue bronchoscopy   History of present illness   74 yo M PMH tobacco use, infrarenal AAA, anxiety/depression, recent pyelonephritis, BPH, who presented to [facility] 7/5 with CC hemoptysis. Hemoptysis began 1 week prior to presentation and has worsened, now with presence of larger blood clots. Denies chest pain, sore throat, epistaxis. Endorses unintentional weight loww, nausea, decreased energy. Recent CT c/a/p revealed significant L sided hilar mass, subcarinal and hilar lymph node enlargement, as well as bilateral renal lesions. He has seen Dr. Delton Coombes with Oncology for initial eval of renal mass (04/02/20) but has not seen in follow up since CT c/a/p a few days ago. Patient to be transferred to [facility] where pulmonary will consult given recent CT findings and likely pursue bronchoscopy. Infrarenal AAA is followed by Dr. Donzetta Matters [facility], where the plan seems to be to pursue endovascular repair.   Past Medical History   Depression Anxiety GERD HLD Tobacco use disorder   Significant Hospital Events   04/30/20 presented to [facility] with worsening hemoptysis. Pulmonary engaged and recommends transfer to [facility] where pulm can be consulted for bronchoscopy   Consults:  Pulm   Procedures:    Significant Diagnostic Tests:  7/2 CT c/a/p> L sided hilar mass 8.2cm, subcarinal and hilar lymph node enlargement, as well as bilateral renal lesions (L 8.9cm, R 5.3 cm). 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   Micro Data:  7/5 SARS Cov2>   Antimicrobials:    Interim history/subjective:  Ar[facility]  Objective   Blood pressure 138/85, pulse  73, temperature 98.4 F (36.9 C), temperature source Oral, resp. rate (!) 23, height 5\' 9"  (1.753 m), weight 72.1 kg, SpO2 99 %.       No intake or output data in the 24 hours ending 04/30/20 1518 Filed Weights   04/30/20 1126  Weight: 72.1 kg    Examination: General: no acute distress HENT: MMM, trachea midline Lungs: diminished on left, clear on right Cardiovascular: RRR, ext warm Abdomen: soft, +BS Extremities: no edema Neuro: moves all 4 ext to command Skin: no rashes  Resolved Hospital Problem list     Assessment & Plan:   Hemoptysis Necrotic L hilar mass Heavy smoking hx 8.2cm x 6.0cm x 7.7 cm.  Subcarinal and bilateral hilar lymph node enlargement - Bronch/EBUS/cryo 7/6 -  NPO after midnight   Bilateral renal lesions -probably represent metastases although odd spot for them  Infrarenal AAA -6.8 cm x 6.6 cm -evaluated by Dr. Donzetta Matters [facility], plan was for repair 7/15   Best practice:  Diet: reg-- NPO after midnight for bronchoscopy 7/6 Pain/Anxiety/Delirium protocol (if indicated):APAP VAP protocol (if indicated): na DVT prophylaxis: SCDs GI prophylaxis: protonix Glucose control: monitor Mobility: Progress per clinical pathway Code Status: DNR Family Communication: Per primary Disposition: MedSurg at [facility]   Labs   CBC: Recent Labs  Lab 04/27/20 0852 04/30/20 1153  WBC 15.0* 13.5*  NEUTROABS 12.1* 11.0*  HGB 10.5* 9.7*  HCT 34.8* 32.7*  MCV 78.7* 79.0*  PLT 522* 490*    Basic Metabolic Panel: Recent Labs  Lab 04/27/20 1033 04/30/20 1153  NA  --  133*  K  --  4.3  CL  --  94*  CO2  --  26  GLUCOSE  --  96  BUN  --  13  CREATININE 1.00 0.96  CALCIUM  --  10.6*   GFR: Estimated Creatinine Clearance: 68.5 mL/min (by C-G formula based on SCr of 0.96 mg/dL). Recent Labs  Lab 04/27/20 0852 04/30/20 1153  WBC 15.0* 13.5*    Liver Function Tests: Recent Labs  Lab 04/30/20 1153  AST 12*  ALT 13  ALKPHOS 75  BILITOT 0.5  PROT 7.7    ALBUMIN 3.2*   No results for input(s): LIPASE, AMYLASE in the last 168 hours. No results for input(s): AMMONIA in the last 168 hours.  ABG No results found for: PHART, PCO2ART, PO2ART, HCO3, TCO2, ACIDBASEDEF, O2SAT   Coagulation Profile: Recent Labs  Lab 04/30/20 1153  INR 1.0    Cardiac Enzymes: No results for input(s): CKTOTAL, CKMB, CKMBINDEX, TROPONINI in the last 168 hours.  HbA1C: No results found for: HGBA1C  CBG: No results for input(s): GLUCAP in the last 168 hours.  Review of Systems:    Positive Symptoms in bold:  Constitutional fevers, chills, weight loss, fatigue, anorexia, malaise  Eyes decreased vision, double vision, eye irritation  Ears, Nose, Mouth, Throat sore throat, trouble swallowing, sinus congestion  Cardiovascular chest pain, paroxysmal nocturnal dyspnea, lower ext edema, palpitations   Respiratory SOB, cough, DOE, hemoptysis, wheezing  Gastrointestinal nausea, vomiting, diarrhea  Genitourinary burning with urination, trouble urinating  Musculoskeletal joint aches, joint swelling, back pain  Integumentary  rashes, skin lesions  Neurological focal weakness, focal numbness, trouble speaking, headaches  Psychiatric depression, anxiety, confusion  Endocrine polyuria, polydipsia, cold intolerance, heat intolerance  Hematologic abnormal bruising, abnormal bleeding, unexplained nose bleeds  Allergic/Immunologic recurrent infections, hives, swollen lymph nodes     Past Medical History  He,  has a past medical history of AAA (abdominal aortic aneurysm) ([place]), Anxiety, Depression, GERD (gastroesophageal reflux disease), and Hyperlipidemia.   Surgical History    Past Surgical History:  Procedure Laterality Date  . ANKLE FRACTURE SURGERY Right 2017  . COLONOSCOPY     Per patient, this was in [place], no polyps  . HERNIA REPAIR  1984  . SPINE SURGERY  1987   lumbar  . TIBIA FRACTURE SURGERY Right 2017     Social History   reports that he  quit smoking about 5 weeks ago. His smoking use included cigarettes. He has a 28.00 pack-year smoking history. He has never used smokeless tobacco. He reports that he does not drink alcohol and does not use drugs.   Family History   His family history includes Alzheimer's disease in his mother; Arthritis in his brother; Dementia in his mother; Glaucoma in his father; Heart disease in his brother and father; Hypertension in his mother; Lung cancer in his paternal uncle; Lymphoma in his sister; Throat cancer in his paternal grandfather. There is no history of Colon cancer, Esophageal cancer, Stomach cancer, or Colon polyps.   Allergies No Known Allergies   Home Medications  Prior to Admission medications   Medication Sig Start Date End Date Taking? Authorizing Provider  acetaminophen (TYLENOL) 650 MG CR tablet Take 650 mg by mouth every 8 (eight) hours as needed for pain.   Yes [provider]  citalopram (CELEXA) 40 MG tablet Take 1 tablet (40 mg total) by mouth daily. Patient taking differently: Take 20 mg by mouth daily.  01/13/20  Yes Sharion Balloon, FNP  dicyclomine (BENTYL) 20 MG tablet Take 1 tablet (20 mg total) by mouth 4 (four) times daily as needed for spasms. Patient taking differently: Take 10 mg by mouth in the morning and at bedtime.  04/10/20  Yes Hawks, Christy A, FNP  diphenhydramine-acetaminophen (TYLENOL PM) 25-500 MG TABS tablet Take 1 tablet by mouth at bedtime as needed (sleep).   Yes [provider]  docusate sodium (COLACE) 100 MG capsule Take 100 mg by mouth daily as needed for mild constipation.   Yes [provider]  Ensure (ENSURE) Take 1 Can by mouth daily.   Yes [provider]  ferrous sulfate 325 (65 FE) MG tablet Take 325 mg by mouth daily with breakfast.   Yes [provider]  Misc Natural Products ([place]) Take 2 capsules by mouth daily.   Yes [provider]  Multiple Vitamin (MULTI  VITAMIN DAILY PO) Take 1 tablet by mouth daily.    Yes [provider]  nicotine polacrilex (NICORETTE) 4 MG gum Take 4 mg by mouth as needed for smoking cessation.   Yes [provider]  omeprazole (PRILOSEC) 20 MG capsule Take 1 capsule (20 mg total) by mouth 2 (two) times daily before a meal. Patient taking differently: Take 20 mg by mouth daily.  03/01/20  Yes Hawks, Christy A, FNP  ondansetron (ZOFRAN) 4 MG tablet Take 1 tablet (4 mg total) by mouth every 8 (eight) hours as needed for nausea or vomiting. 04/10/20  Yes Sharion Balloon, FNP  PRESCRIPTION MEDICATION Place 1 application rectally daily as needed (hemorrhoids). Hydrocortisone 2.5 % Lidocaine 5% Phenylephrine 0.25% Pramoxine 1% rectal cream   Yes [provider]  tamsulosin (FLOMAX) 0.4 MG CAPS capsule Take 0.4 mg by mouth daily. Patient not taking: Reported on 04/30/2020    [provider]

## 2020-04-30 NOTE — ED Triage Notes (Signed)
Coughing up blood x 1 week

## 2020-04-30 NOTE — Progress Notes (Signed)
Pt arrived to room 6N04 via [facility] from [facility] ED. Received report from [place], Therapist, sports. See assessment. Will continue to monitor.

## 2020-05-01 ENCOUNTER — Inpatient Hospital Stay (HOSPITAL_COMMUNITY): Payer: Medicare HMO | Admitting: Hematology

## 2020-05-01 ENCOUNTER — Ambulatory Visit
Admit: 2020-05-01 | Discharge: 2020-05-01 | Disposition: A | Discharge status: Home / Self Care | Payer: Medicare HMO | Source: Ambulatory Visit | Attending: Radiation Oncology | Admitting: Radiation Oncology

## 2020-05-01 ENCOUNTER — Telehealth: Payer: Self-pay | Admitting: *Deleted

## 2020-05-01 ENCOUNTER — Inpatient Hospital Stay (HOSPITAL_COMMUNITY): Payer: Medicare HMO | Admitting: Registered Nurse

## 2020-05-01 ENCOUNTER — Other Ambulatory Visit: Payer: Self-pay

## 2020-05-01 ENCOUNTER — Encounter (HOSPITAL_COMMUNITY)
Admission: EM | Disposition: A | Discharge status: Home / Self Care | Payer: Self-pay | Source: Home / Self Care | Attending: Internal Medicine

## 2020-05-01 ENCOUNTER — Encounter (HOSPITAL_COMMUNITY): Payer: Self-pay | Admitting: Internal Medicine

## 2020-05-01 DIAGNOSIS — C3402 Malignant neoplasm of left main bronchus: Principal | ICD-10-CM

## 2020-05-01 DIAGNOSIS — C7931 Secondary malignant neoplasm of brain: Secondary | ICD-10-CM

## 2020-05-01 DIAGNOSIS — I714 Abdominal aortic aneurysm, without rupture: Secondary | ICD-10-CM

## 2020-05-01 HISTORY — PX: ENDOBRONCHIAL ULTRASOUND: SHX5096

## 2020-05-01 HISTORY — PX: VIDEO BRONCHOSCOPY: SHX5072

## 2020-05-01 HISTORY — PX: CRYOTHERAPY: SHX6894

## 2020-05-01 HISTORY — PX: BRONCHIAL NEEDLE ASPIRATION BIOPSY: SHX5106

## 2020-05-01 LAB — SURGICAL PCR SCREEN
MRSA, PCR: NEGATIVE
Staphylococcus aureus: NEGATIVE

## 2020-05-01 LAB — PROTEIN ELECTROPHORESIS, SERUM
A/G Ratio: 0.8 (ref 0.7–1.7)
Albumin ELP: 2.9 g/dL (ref 2.9–4.4)
Alpha-1-Globulin: 0.4 g/dL (ref 0.0–0.4)
Alpha-2-Globulin: 1.3 g/dL — ABNORMAL HIGH (ref 0.4–1.0)
Beta Globulin: 1.3 g/dL (ref 0.7–1.3)
Gamma Globulin: 0.8 g/dL (ref 0.4–1.8)
Globulin, Total: 3.8 g/dL (ref 2.2–3.9)
Total Protein ELP: 6.7 g/dL (ref 6.0–8.5)

## 2020-05-01 SURGERY — VIDEO BRONCHOSCOPY WITHOUT FLUORO
Anesthesia: General | Laterality: Left

## 2020-05-01 MED ORDER — ADULT MULTIVITAMIN W/MINERALS CH
1.0000 | ORAL_TABLET | Freq: Every day | ORAL | Status: DC
  Administered 2020-05-01 – 2020-05-04 (×4): 1 via ORAL
  Filled 2020-05-01 (×4): qty 1

## 2020-05-01 MED ORDER — SODIUM CHLORIDE 0.9 % IV SOLN: INTRAVENOUS | Status: DC

## 2020-05-01 MED ORDER — PROPOFOL 10 MG/ML IV BOLUS
INTRAVENOUS | Status: DC | PRN
  Administered 2020-05-01: 150 mg via INTRAVENOUS
  Administered 2020-05-01: 50 mg via INTRAVENOUS

## 2020-05-01 MED ORDER — LIDOCAINE 2% (20 MG/ML) 5 ML SYRINGE
INTRAMUSCULAR | Status: DC | PRN
  Administered 2020-05-01: 60 mg via INTRAVENOUS

## 2020-05-01 MED ORDER — ONDANSETRON HCL 4 MG/2ML IJ SOLN
INTRAMUSCULAR | Status: DC | PRN
  Administered 2020-05-01: 4 mg via INTRAVENOUS

## 2020-05-01 MED ORDER — DEXAMETHASONE SODIUM PHOSPHATE 10 MG/ML IJ SOLN
INTRAMUSCULAR | Status: DC | PRN
  Administered 2020-05-01: 5 mg via INTRAVENOUS

## 2020-05-01 MED ORDER — ROCURONIUM BROMIDE 10 MG/ML (PF) SYRINGE
PREFILLED_SYRINGE | INTRAVENOUS | Status: DC | PRN
  Administered 2020-05-01: 60 mg via INTRAVENOUS

## 2020-05-01 MED ORDER — ENSURE ENLIVE PO LIQD
237.0000 mL | Freq: Three times a day (TID) | ORAL | Status: DC
  Administered 2020-05-01 – 2020-05-03 (×6): 237 mL via ORAL

## 2020-05-01 MED ORDER — FENTANYL CITRATE (PF) 250 MCG/5ML IJ SOLN
INTRAMUSCULAR | Status: DC | PRN
  Administered 2020-05-01: 100 ug via INTRAVENOUS

## 2020-05-01 MED ORDER — DEXAMETHASONE SODIUM PHOSPHATE 4 MG/ML IJ SOLN
4.0000 mg | Freq: Four times a day (QID) | INTRAMUSCULAR | Status: DC
  Administered 2020-05-01 – 2020-05-04 (×12): 4 mg via INTRAVENOUS
  Filled 2020-05-01 (×14): qty 1

## 2020-05-01 MED ORDER — SUGAMMADEX SODIUM 200 MG/2ML IV SOLN
INTRAVENOUS | Status: DC | PRN
  Administered 2020-05-01 (×2): 100 mg via INTRAVENOUS

## 2020-05-01 NOTE — Transfer of Care (Signed)
Immediate Anesthesia Transfer of Care Note  Patient: Michael Horton  Procedure(s) Performed: VIDEO BRONCHOSCOPY (Left ) ENDOBRONCHIAL ULTRASOUND (Left ) BRONCHIAL NEEDLE ASPIRATION BIOPSIES CRYOTHERAPY  Patient Location: Endoscopy Unit  Anesthesia Type:General  Level of Consciousness: drowsy  Airway & Oxygen Therapy: Patient Spontanous Breathing and Patient connected to face mask oxygen  Post-op Assessment: Report given to RN and Post -op Vital signs reviewed and stable  Post vital signs: Reviewed and stable  Last Vitals:  Vitals Value Taken Time  BP 121/70 05/01/20 1535  Temp    Pulse 70 05/01/20 1536  Resp 19 05/01/20 1536  SpO2 100 % 05/01/20 1536  Vitals shown include unvalidated device data.  Last Pain:  Vitals:   05/01/20 1413  TempSrc: Oral  PainSc: 0-No pain      Patients Stated Pain Goal: 0 (48/18/59 0931)  Complications: No complications documented.

## 2020-05-01 NOTE — Progress Notes (Signed)
Initial Nutrition Assessment  DOCUMENTATION CODES:   Not applicable  INTERVENTION:   -Ensure Enlive po TID, each supplement provides 350 kcal and 20 grams of protein -MVI with minerals daily  NUTRITION DIAGNOSIS:   Increased nutrient needs related to chronic illness as evidenced by estimated needs.  GOAL:   Patient will meet greater than or equal to 90% of their needs  MONITOR:   PO intake, Supplement acceptance, Labs, Weight trends, Skin, I & O's  REASON FOR ASSESSMENT:   Malnutrition Screening Tool    ASSESSMENT:   Michael Horton is a 74 y.o. male with medical history significant for infrarenal AAA, anxiety/depression, prior tobacco use, GERD, iron deficiency anemia, recent pyelonephritis, and BPH who presented to the ED after having coughed up dark blood with some clots over the last 1 week.  Pt admitted with hemoptysis.   Reviewed I/O's: -250 ml x 24 hours  Attempted to see pt x 4. Pt was receiving nursing care x 2 or out of room at time of visits. Also attempted to reach pt via phone call to room, however, no answer.   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 No plans for surgical resection, but recommending radiation oncology for possible palliative radiation.   Pt currently NPO for bronchoscopy.   Reviewed wt hx; pt has experienced a 4.9% wt loss over the past month. While this is not significant for time frame, it is concerning given pt's possible prognosis. Pt awaiting oncology consultation.   Labs reviewed: Na: 131.   Diet Order:   Diet Order            Diet regular Room service appropriate? Yes; Fluid consistency: Thin  Diet effective now                 EDUCATION NEEDS:   No education needs have been identified at this time  Skin:  Skin Assessment: Reviewed RN Assessment  Last BM:  04/30/20  Height:   Ht Readings from Last 1 Encounters:  05/01/20 5\' 10"  (1.778 m)     Weight:   Wt Readings from Last 1 Encounters:  05/01/20 69.9 kg    Ideal Body Weight:  75.5 kg  BMI:  Body mass index is 22.1 kg/m.  Estimated Nutritional Needs:   Kcal:  2100-2300  Protein:  105-120 grams  Fluid:  > 2.1 L    Loistine Chance, RD, LDN, [place] Registered Dietitian II Certified Diabetes Care and Education Specialist Please refer to [facility] for RD and/or RD on-call/weekend/after hours pager

## 2020-05-01 NOTE — Anesthesia Procedure Notes (Signed)
Procedure Name: Intubation Date/Time: 05/01/2020 2:39 PM Performed by: Trinna Post., CRNA Pre-anesthesia Checklist: Patient identified, Emergency Drugs available, Suction available, Patient being monitored and Timeout performed Patient Re-evaluated:Patient Re-evaluated prior to induction Oxygen Delivery Method: Circle system utilized Preoxygenation: Pre-oxygenation with 100% oxygen Induction Type: IV induction Ventilation: Mask ventilation without difficulty Laryngoscope Size: Mac and 4 Grade View: Grade I Tube type: Oral Tube size: 8.5 mm Number of attempts: 1 Airway Equipment and Method: Stylet Placement Confirmation: ETT inserted through vocal cords under direct vision,  positive ETCO2 and breath sounds checked- equal and bilateral Secured at: 23 cm Tube secured with: Tape Dental Injury: Teeth and Oropharynx as per pre-operative assessment

## 2020-05-01 NOTE — Progress Notes (Signed)
74 yo with likely widespread metastatic Lung CA.  Numerous small mets to brain with one 3.5 cm basal ganglia met which abuts the lateral ventricle on the right.  No role for surgical resection.  Rec Rad onc for possible whole brain palliative radiation.

## 2020-05-01 NOTE — Progress Notes (Signed)
Radiation Oncology         [phone]) [phone] ________________________________  Initial inpatient Consultation  Name: Michael Horton MRN: [id number]  Date of Service: 05/01/2020 DOB: 1946-10-11  DS:KAJGO, Theador Hawthorne, FNP  Candee Furbish, MD   REFERRING PHYSICIAN: Candee Furbish, MD  Reason for Consult: Consideration of palliative radiotherapy to the left hilar mass to control hemoptysis and palliative radiation to the diffuse brain metastases.  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    ICD-10-CM   1. Malignant neoplasm of hilus of left lung (HCC)  C34.02   2. Brain metastases [facility])  C79.31     HISTORY OF PRESENT ILLNESS: Michael Horton is a 74 y.o. male seen at the request of Dr. Tamala Julian. He initially presented to his PCP with abdominal pain in early 02/2019. He developed decreased energy and weight loss and subsequently underwent CT A/P on 03/21/2020 showing an indistinct left renal lesion, as well as dense consolidation in the included portion of the left lower lobe lung. A repeat CT A/P was scheduled for 04/27/2020, and he was prescribed doxycycline for suspected pneumonia. He was referred to Dr. Delton Coombes on 04/02/2020, who agreed with repeating CT A/P.  He met with his PCP for follow up via video visit on 04/10/2020 and was sent for a follow up chest x-ray on 04/13/2020 to re-evaluate the pneumonia.  This scan revealed a mass-like density in the left lower lobe superior segment. Chest CT was recommended, and this was added to the CT A/P scheduled for 04/27/2020. The follow up CT C/A/P on 04/27/20 demonstrated a large, 8.2 cm hyperdense left hilar mass with associated obstruction of the left lower lobe bronchus and total atelectasis of the LLL. Additionally, there were enlarged subcarinal and bilateral hilar lymph nodes,  suspicious for nodal metastatic disease and significant increase in the infiltrating appearing hypodense lesion of the left kidney measuring 8.9 cm and a 5.3 cm lesion in the superior pole of the right kidney but no other evidence of distant metastatic disease. He was scheduled for a follow up visit with Dr. Delton Coombes on 05/01/20 but in the interim, presented to the ED at [facility] on 04/30/2020, with progressive hemoptysis for the past week. He was transferred to [facility] for pulmonary evaluation and consideration of bronchoscopy procedure for tissue confirmation of suspected metastatic primary bronchogenic carcinoma. Flexible bronchosopy, EBUS biopsy of large left hilar mass and endobronchial cryobiopsy of a left endobronchial lesion was performed under the care of Dr. Ina Homes on 05/01/20.  Brain MRI for disease staging was performed on 05/01/20 demonstrating a dominant 3.5 cm centrally necrotic mass in the right basal ganglia with associated edema and mass effect with partial effacement of the lateral and third ventricles with a 9 mm leftward midline shift and leftward subfalcine herniation.  Additionally, at least 20 subcentimeter peripherally enhancing metastases were identified within the supratentorial and infratentorial brain with mild associated edema. Final surgical pathology from bronchoscopy/EBUS confirms invasive NSCLC, squamous cell carcinoma.  We have been asked to consult for consideration of palliative radiotherapy to the left hilar mass to control hemoptysis and palliative radiation to the diffuse brain metastases.   PREVIOUS RADIATION THERAPY: No  PAST MEDICAL HISTORY:  Past Medical History:  Diagnosis Date  . AAA (abdominal aortic aneurysm) ([place])   . Anxiety   . Cancer ([place])  LUNG CANCER  . Depression   . GERD (gastroesophageal reflux disease)   . Hemoptysis 04/2020  . Hyperlipidemia       PAST SURGICAL HISTORY: Past Surgical History:  Procedure Laterality Date  . ANKLE  FRACTURE SURGERY Right 2017  . BRONCHIAL NEEDLE ASPIRATION BIOPSY  05/01/2020   Procedure: BRONCHIAL NEEDLE ASPIRATION BIOPSIES;  Surgeon: Candee Furbish, MD;  Location: [facility] ENDOSCOPY;  Service: Endoscopy;;  . COLONOSCOPY     Per patient, this was in [place], no polyps  . CRYOTHERAPY  05/01/2020   Procedure: CRYOTHERAPY;  Surgeon: Candee Furbish, MD;  Location: [place];  Service: Endoscopy;;  . ENDOBRONCHIAL ULTRASOUND Left 05/01/2020   Procedure: ENDOBRONCHIAL ULTRASOUND;  Surgeon: Candee Furbish, MD;  Location: [facility] ENDOSCOPY;  Service: Endoscopy;  Laterality: Left;  . HERNIA REPAIR  1984  . SPINE SURGERY  1987   lumbar  . TIBIA FRACTURE SURGERY Right 2017  . VIDEO BRONCHOSCOPY Left 05/01/2020   Procedure: VIDEO BRONCHOSCOPY;  Surgeon: Candee Furbish, MD;  Location: [facility] ENDOSCOPY;  Service: Endoscopy;  Laterality: Left;    FAMILY HISTORY:  Family History  Problem Relation Age of Onset  . Dementia Mother   . Alzheimer's disease Mother   . Hypertension Mother   . Heart disease Father   . Glaucoma Father   . Lung cancer Paternal Uncle   . Throat cancer Paternal Grandfather   . Heart disease Brother   . Arthritis Brother        back issues   . Lymphoma Sister   . Colon cancer Neg Hx   . Esophageal cancer Neg Hx   . Stomach cancer Neg Hx   . Colon polyps Neg Hx     SOCIAL HISTORY:  Social History   Socioeconomic History  . Marital status: Married    Spouse name: [name]  . Number of children: 3  . Years of education: Not on file  . Highest education level: 11th grade  Occupational History  . Occupation: RETIRED  Tobacco Use  . Smoking status: Former Smoker    Packs/day: 0.50    Years: 56.00    Pack years: 28.00    Types: Cigarettes    Quit date: 03/21/2020    Years since quitting: 0.1  . Smokeless tobacco: Never Used  . Tobacco comment: quit 03/21/2020  Vaping Use  . Vaping Use: Never used  Substance and Sexual Activity  . Alcohol use: No    Comment: rare  .  Drug use: No  . Sexual activity: Yes    Birth control/protection: None  Other Topics Concern  . Not on file  Social History Narrative  . Not on file   Social Determinants of Health   Financial Resource Strain: Low Risk   . Difficulty of Paying Living Expenses: Not hard at all  Food Insecurity: No Food Insecurity  . Worried About Charity fundraiser in the Last Year: Never true  . Ran Out of Food in the Last Year: Never true  Transportation Needs: No Transportation Needs  . Lack of Transportation (Medical): No  . Lack of Transportation (Non-Medical): No  Physical Activity: Inactive  . Days of Exercise per Week: 0 days  . Minutes of Exercise per Session: 0 min  Stress: Stress Concern Present  . Feeling of Stress : To some extent  Social Connections: Moderately Isolated  . Frequency of Communication with Friends and Family: Once a week  . Frequency of Social Gatherings with Friends and Family: Once a  week  . Attends Religious Services: 1 to 4 times per year  . Active Member of Clubs or Organizations: No  . Attends Archivist Meetings: Never  . Marital Status: Married  Human resources officer Violence: Not At Risk  . Fear of Current or Ex-Partner: No  . Emotionally Abused: No  . Physically Abused: No  . Sexually Abused: No    ALLERGIES: Patient has no known allergies.  MEDICATIONS:  No current facility-administered medications for this encounter.   Current Outpatient Medications  Medication Sig Dispense Refill  . dexamethasone (DECADRON) 4 MG tablet Take 1 tablet (4 mg total) by mouth 4 (four) times daily. 120 tablet 0  . tamsulosin (FLOMAX) 0.4 MG CAPS capsule Take 1 capsule (0.4 mg total) by mouth daily after supper. 30 capsule 0  . traMADol (ULTRAM) 50 MG tablet Take 1 tablet (50 mg total) by mouth every 6 (six) hours as needed for up to 5 days for moderate pain. 14 tablet 0   Facility-Administered Medications Ordered in Other Encounters  Medication Dose Route  Frequency Provider Last Rate Last Admin  . 0.9 %  sodium chloride infusion  250 mL Intravenous PRN Manuella Ghazi, Pratik D, DO      . 0.9 %  sodium chloride infusion   Intravenous Continuous Georgette Shell, MD 75 mL/hr at 05/03/20 1852 New Bag at 05/03/20 1852  . acetaminophen (TYLENOL) tablet 650 mg  650 mg Oral Q6H PRN Heath Lark D, DO   650 mg at 05/03/20 2114   Or  . acetaminophen (TYLENOL) suppository 650 mg  650 mg Rectal Q6H PRN Manuella Ghazi, Pratik D, DO      . citalopram (CELEXA) tablet 20 mg  20 mg Oral Daily Manuella Ghazi, Pratik D, DO   20 mg at 05/04/20 0817  . dexamethasone (DECADRON) injection 4 mg  4 mg Intravenous Q6H Georgette Shell, MD   4 mg at 05/04/20 1243  . dicyclomine (BENTYL) capsule 20 mg  20 mg Oral QID PRN Manuella Ghazi, Pratik D, DO      . diphenhydrAMINE (BENADRYL) capsule 25 mg  25 mg Oral QHS PRN Manuella Ghazi, Pratik D, DO   25 mg at 05/02/20 2003  . docusate sodium (COLACE) capsule 100 mg  100 mg Oral Daily PRN Manuella Ghazi, Pratik D, DO      . feeding supplement (ENSURE ENLIVE) (ENSURE ENLIVE) liquid 237 mL  237 mL Oral TID BM Georgette Shell, MD   237 mL at 05/03/20 2012  . ferrous sulfate tablet 325 mg  325 mg Oral Q breakfast Heath Lark D, DO   325 mg at 05/04/20 0865  . multivitamin with minerals tablet 1 tablet  1 tablet Oral Daily Georgette Shell, MD   1 tablet at 05/04/20 [phone]  . mupirocin ointment (BACTROBAN) 2 % 1 application  1 application Nasal BID Heath Lark D, DO   1 application at 96/29/52 8413  . nicotine polacrilex (NICORETTE) gum 4 mg  4 mg Oral PRN Manuella Ghazi, Pratik D, DO      . ondansetron (ZOFRAN) tablet 4 mg  4 mg Oral Q6H PRN Manuella Ghazi, Pratik D, DO       Or  . ondansetron (ZOFRAN) injection 4 mg  4 mg Intravenous Q6H PRN Manuella Ghazi, Pratik D, DO      . pantoprazole (PROTONIX) EC tablet 40 mg  40 mg Oral Daily Little Ishikawa, MD   40 mg at 05/04/20 0817  . sodium chloride flush (NS) 0.9 % injection 3 mL  3  mL Intravenous Q12H Shah, Pratik D, DO   3 mL at 05/01/20 1124  .  sodium chloride flush (NS) 0.9 % injection 3 mL  3 mL Intravenous PRN Manuella Ghazi, Pratik D, DO   3 mL at 05/01/20 1123  . tamsulosin (FLOMAX) capsule 0.4 mg  0.4 mg Oral Daily Manuella Ghazi, Pratik D, DO   0.4 mg at 05/04/20 0817  . traMADol (ULTRAM) tablet 50 mg  50 mg Oral Q6H PRN Antonieta Pert, MD   50 mg at 05/04/20 0092    REVIEW OF SYSTEMS:  On review of systems, the patient reports that the hemoptysis is improved since the time of his bronchoscopy procedure. He denies any chest pain, increased shortness of breath, productive cough, fevers, chills, or night sweats.  He has had approximately 15 lb unintended weight loss over the past 4-6 weeks due to decreased appetite and nausea. He denies any bowel or bladder disturbances, and denies abdominal pain, diarrhea or vomiting. He denies any new musculoskeletal or joint aches or pains and has not had any headaches, imbalance, dizziness or changes in auditory or visual acuity. He reports that he quit smoking cigarettes approximately 5 weeks ago. A complete review of systems is obtained and is otherwise negative.    PHYSICAL EXAM:  Wt Readings from Last 3 Encounters:  05/02/20 154 lb 12.8 oz (70.2 kg)  04/20/20 157 lb (71.2 kg)  04/18/20 159 lb 9.6 oz (72.4 kg)   Temp Readings from Last 3 Encounters:  05/04/20 97.6 F (36.4 C) (Oral)  04/20/20 (!) 97.5 F (36.4 C)  04/18/20 (!) 97.5 F (36.4 C) (Oral)   BP Readings from Last 3 Encounters:  05/04/20 (!) 156/89  04/20/20 124/80  04/18/20 124/73   Pulse Readings from Last 3 Encounters:  05/04/20 (!) 56  04/20/20 71  04/18/20 80    /10  In general this is a well appearing Caucasian male in no acute distress. He is alert and oriented x3 and cooperative throughout the examination. HEENT reveals that the patient is normocephalic, atraumatic. EOMs are intact. PERRLA. Skin is intact without any evidence of gross lesions. Cardiopulmonary assessment is negative for acute distress and he exhibits normal effort.  Lymphatic assessment is performed and does not reveal any adenopathy in the cervical, supraclavicular, or axillary chains. Abdomen has active bowel sounds in all quadrants and is intact. The abdomen is soft, non tender, non distended. Lower extremities are negative for pretibial pitting edema, deep calf tenderness, cyanosis or clubbing.   KPS = 50  100 - Normal; no complaints; no evidence of disease. 90   - Able to carry on normal activity; minor signs or symptoms of disease. 80   - Normal activity with effort; some signs or symptoms of disease. 27   - Cares for self; unable to carry on normal activity or to do active work. 60   - Requires occasional assistance, but is able to care for most of his personal needs. 50   - Requires considerable assistance and frequent medical care. 68   - Disabled; requires special care and assistance. 97   - Severely disabled; hospital admission is indicated although death not imminent. 18   - Very sick; hospital admission necessary; active supportive treatment necessary. 10   - Moribund; fatal processes progressing rapidly. 0     - Dead  Karnofsky DA, Abelmann WH, Craver LS and [facility] [phone]) The use of the nitrogen mustards in the palliative treatment of carcinoma: with particular reference to bronchogenic carcinoma Cancer  1 634-56  LABORATORY DATA:  Lab Results  Component Value Date   WBC 9.4 05/02/2020   HGB 8.5 (L) 05/02/2020   HCT 28.4 (L) 05/02/2020   MCV 78.2 (L) 05/02/2020   PLT 409 (H) 05/02/2020   Lab Results  Component Value Date   NA 132 (L) 05/02/2020   K 4.6 05/02/2020   CL 99 05/02/2020   CO2 24 05/02/2020   Lab Results  Component Value Date   ALT 16 05/02/2020   AST 13 (L) 05/02/2020   ALKPHOS 61 05/02/2020   BILITOT 0.1 (L) 05/02/2020     RADIOGRAPHY: CT ABDOMEN PELVIS W WO CONTRAST  Result Date: 04/28/2020 CLINICAL DATA:  Follow-up pyelonephritis EXAM: CT CHEST WITH CONTRAST CT ABDOMEN AND PELVIS WITHOUT AND WITH  CONTRAST TECHNIQUE: Multidetector CT imaging of the chest was performed following the standard protocol following the bolus administration of intravenous contrast. Multidetector CT imaging of the abdomen and pelvis was performed following the standard protocol before and following the bolus administration of intravenous contrast. Additional oral enteric contrast was administered. CONTRAST:  11m OMNIPAQUE IOHEXOL 300 MG/ML SOLN, additional oral enteric contrast COMPARISON:  CT abdomen pelvis, 03/21/2020 FINDINGS: CT CHEST Cardiovascular: Aortic atherosclerosis. Three-vessel coronary artery calcifications. Normal heart size. No pericardial effusion. Mediastinum/Nodes: Enlarged subcarinal and bilateral hilar lymph nodes, measuring up to 2.9 x 1.6 cm (series 12, image 56). Moderate hiatal hernia with intrathoracic position of the gastric fundus. Thyroid gland, trachea, and esophagus demonstrate no significant findings. Lungs/Pleura: Mild centrilobular emphysema. There is a large, heterogeneously hyperdense left hilar mass, measuring approximately 8.2 x 6.0 x 7.7 cm. There is obstruction of the left lower lobe bronchus and total atelectasis of the left lower lobe. Trace left pleural effusion. Clustered postobstructive nodularity in the left pulmonary apex (series 14, image 27). Musculoskeletal: No chest wall mass or suspicious bone lesions identified. CT ABDOMEN AND PELVIS Hepatobiliary: No solid liver abnormality is seen. No gallstones, gallbladder wall thickening, or biliary dilatation. Pancreas: Unremarkable. No pancreatic ductal dilatation or surrounding inflammatory changes. Spleen: Normal in size without significant abnormality. Adrenals/Urinary Tract: Adrenal glands are unremarkable. There has been a significant increase in infiltrative appearing, hypodense lesion of the left kidney, which now occupies most of the kidney and measures approximately 8.9 x 5.7 x 5.3 cm (series 10, image 66). There is an additional  lesion of the superior pole of the right kidney, which is now much more clearly appreciated than subtle appearance on prior examination, measuring 5.3 x 4.9 x 4.8 cm (series 10, image 70). Bladder is unremarkable. Stomach/Bowel: Stomach is within normal limits. Appendix appears normal. No evidence of bowel wall thickening, distention, or inflammatory changes. Sigmoid diverticula. Vascular/Lymphatic: Aortic atherosclerosis. 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 Additional aneurysm of the bilateral common iliac arteries. No enlarged abdominal or pelvic lymph nodes. Reproductive: No mass or other significant abnormality. Other: No abdominal wall hernia or abnormality. No abdominopelvic ascites. Musculoskeletal: No acute or significant osseous findings. IMPRESSION: 1. There is a large, heterogeneously hyperdense left hilar mass, measuring approximately 8.2 cm. There is obstruction of the left lower lobe bronchus and total atelectasis of the left lower lobe. 2. Enlarged subcarinal and bilateral hilar lymph nodes, suspicious for nodal metastatic disease. 3. There has been a significant increase in infiltrative appearing, hypodense lesion of the left kidney, which now occupies most of the kidney and measures approximately 8.9 cm. There is an additional  lesion of the superior pole of the right kidney, which is now much more clearly appreciated than subtle appearance on prior examination, measuring 5.3 cm. 4. 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 No other evidence of distant metastatic disease is appreciated. 5. 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 Recommend surgical consultation. Aortic aneurysm NOS (ICD10-I71.9).  Aortic Atherosclerosis (ICD10-I70.0). 6. Emphysema (ICD10-J43.9). 7. Coronary artery disease. These results will be called to the ordering clinician or representative by the Radiologist Assistant, and communication documented in the PACS or [employer]. Electronically Signed   By: Eddie Candle M.D.   On: 04/28/2020 12:07   DG Chest 2 View  Result Date: 04/30/2020 CLINICAL DATA:  Hemoptysis for 1 week EXAM: CHEST - 2 VIEW COMPARISON:  Chest x-ray from April 13, 2020, CT chest from April 27, 2020 FINDINGS: Added density along the LEFT lower lobe, superior segment partially visualized on abdominal CT showing no change and undulating borders on the lateral view. Cardiomediastinal contours are otherwise stable. Increased density in the retrocardiac region also likely due in part to hiatal hernia. Signs of compression fracture at T12 similar to prior imaging. RIGHT lung remains clear. IMPRESSION: 1. Stable chest.  Without acute changes since April 13, 2020. 2. Known mass at the LEFT hilum with associated volume loss is similar to the prior study 3. Stable hiatal hernia. 4. Signs of compression fracture in the lower thoracic spine as before. Electronically Signed   By: Zetta Bills M.D.   On: 04/30/2020 13:08   DG Chest 2 View  Result Date: 04/13/2020 CLINICAL DATA:  Community acquired pneumonia EXAM: CHEST - 2 VIEW COMPARISON:  01/02/2017, CT from 03/21/2020 FINDINGS: Cardiac shadow is stable. Tortuous thoracic aorta with calcifications is seen. Hiatal hernia is again noted. Increased soft tissue density is noted over the thoracic spine posteriorly best noted on the lateral projection but overlapping the hila on the frontal. No focal infiltrate is seen. These changes are highly suspicious for underlying mass particularly given the patient's history of smoking. Previously seen lower lobe consolidation on CT has improved. IMPRESSION: Masslike density in the left lower lobe superior segment best visualized on the  lateral projection. CT of the chest with contrast is recommended for further evaluation. These results will be called to the ordering clinician or representative by the Radiologist Assistant, and communication documented in the PACS or [employer]. Electronically Signed   By: Inez Catalina M.D.   On: 04/13/2020 15:09   CT CHEST W CONTRAST  Result Date: 04/28/2020 CLINICAL DATA:  Follow-up pyelonephritis EXAM: CT CHEST WITH CONTRAST CT ABDOMEN AND PELVIS WITHOUT AND WITH CONTRAST TECHNIQUE: Multidetector CT imaging of the chest was performed following the standard protocol following the bolus administration of intravenous contrast. Multidetector CT imaging of the abdomen and pelvis was performed following the standard protocol before and following the bolus administration of intravenous contrast. Additional oral enteric contrast was administered. CONTRAST:  137m OMNIPAQUE IOHEXOL 300 MG/ML SOLN, additional oral enteric contrast COMPARISON:  CT abdomen pelvis, 03/21/2020 FINDINGS: CT CHEST Cardiovascular: Aortic atherosclerosis. Three-vessel coronary artery calcifications. Normal heart size. No pericardial effusion. Mediastinum/Nodes: Enlarged subcarinal and bilateral hilar lymph nodes, measuring up to 2.9 x 1.6 cm (series 12, image 56). Moderate hiatal hernia with intrathoracic position of the gastric fundus. Thyroid gland, trachea, and esophagus demonstrate no significant findings. Lungs/Pleura: Mild  centrilobular emphysema. There is a large, heterogeneously hyperdense left hilar mass, measuring approximately 8.2 x 6.0 x 7.7 cm. There is obstruction of the left lower lobe bronchus and total atelectasis of the left lower lobe. Trace left pleural effusion. Clustered postobstructive nodularity in the left pulmonary apex (series 14, image 27). Musculoskeletal: No chest wall mass or suspicious bone lesions identified. CT ABDOMEN AND PELVIS Hepatobiliary: No solid liver abnormality is seen. No gallstones,  gallbladder wall thickening, or biliary dilatation. Pancreas: Unremarkable. No pancreatic ductal dilatation or surrounding inflammatory changes. Spleen: Normal in size without significant abnormality. Adrenals/Urinary Tract: Adrenal glands are unremarkable. There has been a significant increase in infiltrative appearing, hypodense lesion of the left kidney, which now occupies most of the kidney and measures approximately 8.9 x 5.7 x 5.3 cm (series 10, image 66). There is an additional lesion of the superior pole of the right kidney, which is now much more clearly appreciated than subtle appearance on prior examination, measuring 5.3 x 4.9 x 4.8 cm (series 10, image 70). Bladder is unremarkable. Stomach/Bowel: Stomach is within normal limits. Appendix appears normal. No evidence of bowel wall thickening, distention, or inflammatory changes. Sigmoid diverticula. Vascular/Lymphatic: Aortic atherosclerosis. 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 Additional aneurysm of the bilateral common iliac arteries. No enlarged abdominal or pelvic lymph nodes. Reproductive: No mass or other significant abnormality. Other: No abdominal wall hernia or abnormality. No abdominopelvic ascites. Musculoskeletal: No acute or significant osseous findings. IMPRESSION: 1. There is a large, heterogeneously hyperdense left hilar mass, measuring approximately 8.2 cm. There is obstruction of the left lower lobe bronchus and total atelectasis of the left lower lobe. 2. Enlarged subcarinal and bilateral hilar lymph nodes, suspicious for nodal metastatic disease. 3. There has been a significant increase in infiltrative appearing, hypodense lesion of the left kidney, which now occupies most of the kidney and measures approximately 8.9 cm. There is an additional lesion of the superior pole of the right kidney, which is now much more clearly  appreciated than subtle appearance on prior examination, measuring 5.3 cm. 4. 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 No other evidence of distant metastatic disease is appreciated. 5. 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 Recommend surgical consultation. Aortic aneurysm NOS (ICD10-I71.9). Aortic Atherosclerosis (ICD10-I70.0). 6. Emphysema (ICD10-J43.9). 7. Coronary artery disease. These results will be called to the ordering clinician or representative by the Radiologist Assistant, and communication documented in the PACS or [employer]. Electronically Signed   By: Eddie Candle M.D.   On: 04/28/2020 12:07   MR BRAIN W WO CONTRAST  Addendum Date: 04/30/2020   ADDENDUM REPORT: 04/30/2020 20:25 ADDENDUM: These results were called by telephone at the time of interpretation on 04/30/2020 at 8:25 pm to provider NP Lang Snow , who verbally acknowledged these results. Electronically Signed   By: Kellie Simmering DO   On: 04/30/2020 20:25   Result Date: 04/30/2020 CLINICAL DATA:  Headache, acute, normal neuro exam. Additional history provided: Left-sided hilar mass, subcarinal and hilar lymph node enlargement, bilateral renal lesions. EXAM: MRI HEAD WITHOUT AND WITH CONTRAST TECHNIQUE: Multiplanar, multiecho pulse sequences of the brain and surrounding structures were obtained without and with intravenous contrast. CONTRAST:  81m GADAVIST GADOBUTROL 1 MMOL/ML IV SOLN COMPARISON:  No  pertinent prior studies available for comparison. 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 These lesions predominately demonstrate peripheral enhancement and corresponding restricted diffusion. These lesions are as follows. Peripherally enhancing, centrally necrotic  mass centered within the right basal ganglia measuring 3.5 x 3.4 x 3.3 cm (series 10, image 31). There is prominent surrounding edema within the basal ganglia, also extending into the adjacent white matter tracks, right thalamus, anterior corpus callosum, right temporal lobe and into the midbrain. There is associated mass effect with partial effacement of the lateral and third ventricles. 9 mm leftward midline shift measured at the level of the septum pellucidum. There is leftward subfalcine herniation. At least 20 additional subcentimeter peripherally enhancing metastases are identified within the supratentorial and infratentorial brain (please see annotations on the axial postcontrast T1 weighted sequence. Some these additional metastatic lesions have mild associated edema. Of note, one of these metastases is present within the posterior midbrain near the cerebral aqueduct (series 10, images 23-24). There is no acute infarct. No evidence of intracranial mass. No chronic intracranial blood products. No extra-axial fluid collection. Vascular: Expected proximal arterial flow voids. Non dominant intracranial right vertebral artery. Skull and upper cervical spine: No focal marrow lesion. Sinuses/Orbits: Visualized orbits show no acute finding. Mild paranasal sinus mucosal thickening, most notably ethmoid. No significant mastoid effusion. IMPRESSION: At least 21 supratentorial and infratentorial enhancing parenchymal lesions are identified. 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 A dominant 3.5 cm lesion centered within the right basal ganglia has prominent surrounding edema. There is associated mass effect with partial effacement of the right lateral and third ventricles, and 9 mm leftward midline shift. Associated leftward subfalcine herniation. The remaining metastases are subcentimeter with some of these lesions having mild associated edema. Of note, one of these lesions is  located within the posterior midbrain adjacent to the cerebral aqueduct. No evidence of hydrocephalus at this time. Mild paranasal sinus mucosal thickening. Electronically Signed: By: Kellie Simmering DO On: 04/30/2020 20:03      IMPRESSION/PLAN: 42. 74 y.o. 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 Today, we talked to the patient and family about the findings and workup thus far. We discussed the natural history of metastatic NSCLC, squamous cell carcinoma and general treatment, highlighting the role of palliative radiotherapy in the management of hemoptysis and symptomatic brain disease. We discussed the available radiation techniques, and focused on the details and logistics of delivery. The recommendation is for a 5 fraction course of daily palliative radiotherapy to the left hilar mass and whole brain. We reviewed the anticipated acute and late sequelae associated with radiation in this setting. The patient was encouraged to ask questions that were answered to his satisfaction.  At the conclusion of our conversation, the patient elects to proceed with the recommended 5 fraction course of palliative radiotherapy to the chest and whole brain. He has freely signed written consent to proceed today in the office and a copy of this document will be placed in his medical record. We will proceed with CT SIM/treatment planning following this visit today in anticipation of beginning his radiation treatments on Thursday 05/03/20. He and his wife appear to have a good understanding of his diease and our recommendations which are of palliative intent and are in agreement with the stated plan.  In a visit lasting  70 minutes, greater than 50% of that time was spent in floor time, discussing his case and coordinating his care.   Nicholos Johns, PA-C     Tyler Pita, MD  [facility] Direct Dial: [phone]  Fax: [phone] [email or web address]  Skype  [email or web address]   This document serves as a record of services personally performed by Tyler Pita, MD and Freeman Caldron, PA-C. It was created on their behalf by Wilburn Mylar, a trained medical scribe. The creation of this record is based on the scribe's personal observations and the provider's statements to them. This document has been checked and approved by the attending provider.

## 2020-05-01 NOTE — Telephone Encounter (Signed)
Received call in from endo. Spouse called to cancel procedure. He is currently admitted. Procedure has been cancelled. FYI to [facility]

## 2020-05-01 NOTE — Progress Notes (Addendum)
   NAME:  Michael Horton, MRN:  [id number], DOB:  Mar 26, 1946, LOS: 1 ADMISSION DATE:  04/30/2020, CONSULTATION DATE:  04/30/20 REFERRING MD:  Michael Horton - EM, CHIEF COMPLAINT:  Hemoptysis  Brief History   74 yo M w recent finding of L hilar mass, presents to [facility] with worsening hemoptysis x 1 week.  Transferring to [facility] where Pulmonary will consult and likely pursue bronchoscopy   History of present illness   74 yo M PMH tobacco use, infrarenal AAA, anxiety/depression, recent pyelonephritis, BPH, who presented to [facility] 7/5 with CC hemoptysis. Hemoptysis began 1 week prior to presentation and has worsened, now with presence of larger blood clots. Denies chest pain, sore throat, epistaxis. Endorses unintentional weight loww, nausea, decreased energy. Recent CT c/a/p revealed significant L sided hilar mass, subcarinal and hilar lymph node enlargement, as well as bilateral renal lesions. He has seen Dr. Delton Horton with Oncology for initial eval of renal mass (04/02/20) but has not seen in follow up since CT c/a/p a few days ago. Patient to be transferred to [facility] where pulmonary will consult given recent CT findings and likely pursue bronchoscopy. Infrarenal AAA is followed by Dr. Donzetta Horton [facility], where the plan seems to be to pursue endovascular repair.   Past Medical History   Depression Anxiety GERD HLD Tobacco use disorder   Significant Hospital Events   04/30/20 presented to [facility] with worsening hemoptysis. Pulmonary engaged and recommends transfer to [facility] where pulm can be consulted for bronchoscopy   Consults:  Pulm   Procedures:    Significant Diagnostic Tests:  7/2 CT c/a/p> L sided hilar mass 8.2cm, subcarinal and hilar lymph node enlargement, as well as bilateral renal lesions (L 8.9cm, R 5.3 cm). 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   MRI brain 21 metastases  Micro Data:  7/5 SARS Cov2>   Antimicrobials:    Interim history/subjective:  Seen in endo, coughing up blood but less  volume. Still having posterior headaches, fatigue.  Objective   Blood pressure (!) 162/97, pulse 71, temperature 98.3 F (36.8 C), temperature source Oral, resp. rate (!) 24, height 5\' 10"  (1.778 m), weight 69.9 kg, SpO2 96 %.        Intake/Output Summary (Last 24 hours) at 05/01/2020 1532 Last data filed at 05/01/2020 1520 Gross per 24 hour  Intake 403 ml  Output 260 ml  Net 143 ml   Filed Weights   04/30/20 1126 04/30/20 1743 05/01/20 1413  Weight: 72.1 kg 69.4 kg 69.9 kg    Examination: General: no acute distress HENT: MMM, trachea midline Lungs: diminished on left, clear on right Cardiovascular: RRR, ext warm Abdomen: soft, +BS Extremities: no edema Neuro: moves all 4 ext to command Skin: no rashes  Resolved Hospital Problem list     Assessment & Plan:   Widely metastatic cancer to lung, brain, kidneys - f/u path - given degree of tumor burden, endobronchial obstruction, and brain metastases, I think expedited inpatient treatment is warranted, I will reach out to medical oncology and radiation oncology to see if we can work on this.  Addendum: spoke with Dr. Julien Horton who agrees.  Awaiting call from radiation oncology. - agree with dexamethasone as ordered  Infrarenal AAA -6.8 cm x 6.6 cm -evaluated by Dr. Donzetta Horton [facility], plan was for repair 7/15 but this may need to be put on hold.  Michael Emery MD PCCM

## 2020-05-01 NOTE — Anesthesia Preprocedure Evaluation (Addendum)
Anesthesia Evaluation  Patient identified by MRN, date of birth, ID band Patient awake    Reviewed: Allergy & Precautions, NPO status , Patient's Chart, lab work & pertinent test results  History of Anesthesia Complications Negative for: history of anesthetic complications  Airway Mallampati: III  TM Distance: >3 FB Neck ROM: Limited    Dental  (+) Dental Advisory Given   Pulmonary former smoker,   Lung mass    Pulmonary exam normal        Cardiovascular Normal cardiovascular exam   Infrarenal AAA - 6.8 x 6.6 cm - plan for endovascular stent placement 05/10/20      Neuro/Psych PSYCHIATRIC DISORDERS Anxiety Depression  Multiple metastatic lesions in the brain     GI/Hepatic Neg liver ROS, GERD  Medicated and Controlled,  Endo/Other   Hyponatremia   Renal/GU  Left renal mass       Musculoskeletal negative musculoskeletal ROS (+)   Abdominal   Peds  Hematology  (+) anemia ,   Anesthesia Other Findings Covid test negative  '21 CT Chest/Abd/Pelvis - 1. There is a large, heterogeneously hyperdense left hilar mass, measuring approximately 8.2 cm. There is obstruction of the left lower lobe bronchus and total atelectasis of the left lower lobe. 2. Enlarged subcarinal and bilateral hilar lymph nodes, suspicious for nodal metastatic disease. 3. There has been a significant increase in infiltrative appearing, hypodense lesion of the left kidney, which now occupies most of the kidney and measures approximately 8.9 cm. There is an additional lesion of the superior pole of the right kidney, which is now much more clearly appreciated than subtle appearance on prior examination, measuring 5.3 cm. 4. 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 No other evidence of distant metastatic disease is appreciated. 5.  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 Recommend surgical consultation. Aortic aneurysm NOS (ICD10-I71.9). Aortic Atherosclerosis (ICD10-I70.0). 6. Emphysema (ICD10-J43.9). 7. Coronary artery disease.  Reproductive/Obstetrics                            Anesthesia Physical Anesthesia Plan  ASA: IV  Anesthesia Plan: General   Post-op Pain Management:    Induction: Intravenous  PONV Risk Score and Plan: 2 and Treatment may vary due to age or medical condition, Ondansetron and Dexamethasone  Airway Management Planned: Oral ETT  Additional Equipment: None  Intra-op Plan:   Post-operative Plan: Extubation in OR  Informed Consent: I have reviewed the patients History and Physical, chart, labs and discussed the procedure including the risks, benefits and alternatives for the proposed anesthesia with the patient or authorized representative who has indicated his/her understanding and acceptance.   Patient has DNR.  Discussed DNR with patient.   Dental advisory given  Plan Discussed with: CRNA and Anesthesiologist  Anesthesia Plan Comments: (Discussed DNR with patient at length. Patient agreeable to intubation as indicated by the procedure, as well as IV fluids and medications to support his hemodynamics. Patient stated "if I die on the table, let me go. I don't want to be brought back." Explained to patient that this means if his heart were to stop at any point, we would not be doing chest compressions and would let him pass. Patient expressed understanding and was agreeable to this plan.)       Anesthesia Quick Evaluation

## 2020-05-01 NOTE — Telephone Encounter (Signed)
Noted. Patient currently admitted at [facility] for hemoptysis with large left hilar mass with plans for bronchoscopy today. Routing to AB who saw patient last.

## 2020-05-01 NOTE — Progress Notes (Addendum)
PROGRESS NOTE    Michael Horton  YHC:[id number] DOB: Jul 21, 1946 DOA: 04/30/2020 PCP: Sharion Balloon, FNP    Brief Narrative:Michael Horton is a 74 y.o. male with medical history significant for infrarenal AAA, anxiety/depression, prior tobacco use, GERD, iron deficiency anemia, recent pyelonephritis, and BPH who presented to the ED after having coughed up dark blood with some clots over the last 1 week.  He denies any chest pain, sore throat, epistaxis, or any other issues.  He does not appear to be on blood thinners.  He is scheduled for an abdominal aortic endovascular stent to be placed by Dr. Magdalene Molly on 7/15 as well.  He has had recent pyelonephritis that was treated by his PCP and has had a recent CT chest/abdomen/pelvis performed demonstrating a significant left sided hilar mass as well as left and right kidney mass.  He denies any fevers or chills or sick contacts.  He has apparently lost approximately 15 pounds unintentionally in the last several weeks.   ED Course: Stable vital signs noted and leukocytosis of 13,500 noted as well as sodium 133.  Hemoglobin is 9.7 compared to approximately 12, 1 month ago.  2 view chest x-ray with finding of large left hilar mass and noted chronic thoracic compression fracture.  Covid testing pending.  Assessment & Plan:   Active Problems:   Hemoptysis   #1 left hilar mass 8 by 2 x 6 x 7.7 with hemoptysis with history of tobacco abuse-for bronchoscopy today he is n.p.o. after midnight CT chest abdomen and pelvis-04/27/2020 with left-sided hilar mass subcarinal and hilar lymph node enlargement with bilateral renal lesions. Slow IV fluids since he is n.p.o. for the procedure.   #2 bilateral kidney masses follow-up with oncology Dr. Delton Coombes as an outpatient.  #3 infrarenal aortic aneurysm- followed by Dr. Donzetta Matters for endovascular repair on 05/10/2020.  Measuring 6.8 x 6.6 cm.  #4 anxiety and depression continue citalopram  #5 history of BPH continue  Flomax  #6 tobacco abuse refuses nicotine patch  #7 GERD continue PPI  #8 mild hyponatremia likely multifactorial start normal saline.  #9 leukocytosis no clear evidence of infection noted.  #10 multiple brain lesions MRI brain 04/30/2020-At least 21 supratentorial and infratentorial enhancing parenchymal lesions are identified. 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 A dominant 3.5 cm lesion centered within the right basal ganglia has prominent surrounding edema. There is associated mass effect with partial effacement of the right lateral and third ventricles, and 9 mm leftward midline shift. Associated leftward subfalcine herniation.  The remaining metastases are subcentimeter with some of these lesions having mild associated edema. Of note, one of these lesions is located within the posterior midbrain adjacent to the cerebral aqueduct. No evidence of hydrocephalus at this time.  Mild paranasal sinus mucosal thickening.  Will start decadron.  Discussed with Dr. Trenton Gammon.   Estimated body mass index is 21.95 kg/m as calculated from the following:   Height as of this encounter: 5\' 10"  (1.778 m).   Weight as of this encounter: 69.4 kg.  DVT prophylaxis: SCD Code Status: DNR Family Communication: None at bedside Disposition Plan:  Status is: Inpatient  Dispo: The patient is from: Home              Anticipated d/c is to: Home              Anticipated d/c date is: Unknown.  Patient has widespread what appears to be high likelihood of metastatic lesions. Patient and  wife want to talk about it and decide how aggressive they want treatments to be.              Patient currently is not medically stable to d/c.  Patient was transferred to [facility] from [facility] for hemoptysis with lung mass for bronc today New findings of brain lesions by MRI 04/30/2020    Consultants:   PCCM  Procedures: None Antimicrobials: None  Subjective: Patient  resting in bed denies any further hemoptysis Walking to the restroom no new complaints Objective: Vitals:   04/30/20 2014 04/30/20 2054 05/01/20 0122 05/01/20 0455  BP: 136/88  124/74 140/84  Pulse: 73  67 64  Resp: 16 17 16 18   Temp: 98.6 F (37 C)  98.1 F (36.7 C) 98.4 F (36.9 C)  TempSrc: Oral  Oral Oral  SpO2: 98%  96% 97%  Weight:      Height:        Intake/Output Summary (Last 24 hours) at 05/01/2020 1101 Last data filed at 05/01/2020 0021 Gross per 24 hour  Intake --  Output 250 ml  Net -250 ml   Filed Weights   04/30/20 1126 04/30/20 1743  Weight: 72.1 kg 69.4 kg    Examination:  General exam: Appears calm and comfortable  Respiratory system: Clear to auscultation. Respiratory effort normal. Cardiovascular system: S1 & S2 heard, RRR. No JVD, murmurs, rubs, gallops or clicks. No pedal edema. Gastrointestinal system: Abdomen is nondistended, soft and nontender. No organomegaly or masses felt. Normal bowel sounds heard. Central nervous system: Alert and oriented. No focal neurological deficits. Extremities: Symmetric 5 x 5 power. Skin: No rashes, lesions or ulcers Psychiatry: Judgement and insight appear normal. Mood & affect appropriate.     Data Reviewed: I have personally reviewed following labs and imaging studies  CBC: Recent Labs  Lab 04/27/20 0852 04/30/20 1153 04/30/20 2100  WBC 15.0* 13.5* 14.4*  NEUTROABS 12.1* 11.0*  --   HGB 10.5* 9.7* 9.3*  HCT 34.8* 32.7* 30.3*  MCV 78.7* 79.0* 78.1*  PLT 522* 490* 017*   Basic Metabolic Panel: Recent Labs  Lab 04/27/20 1033 04/30/20 1153 04/30/20 2100  NA  --  133* 131*  K  --  4.3 3.9  CL  --  94* 95*  CO2  --  26 24  GLUCOSE  --  96 139*  BUN  --  13 12  CREATININE 1.00 0.96 1.13  CALCIUM  --  10.6* 9.7   GFR: Estimated Creatinine Clearance: 57.2 mL/min (by C-G formula based on SCr of 1.13 mg/dL). Liver Function Tests: Recent Labs  Lab 04/30/20 1153 04/30/20 2100  AST 12* 16  ALT 13  14  ALKPHOS 75 73  BILITOT 0.5 0.4  PROT 7.7 6.7  ALBUMIN 3.2* 2.6*   No results for input(s): LIPASE, AMYLASE in the last 168 hours. No results for input(s): AMMONIA in the last 168 hours. Coagulation Profile: Recent Labs  Lab 04/30/20 1153 04/30/20 2100  INR 1.0 1.0   Cardiac Enzymes: No results for input(s): CKTOTAL, CKMB, CKMBINDEX, TROPONINI in the last 168 hours. BNP (last 3 results) No results for input(s): PROBNP in the last 8760 hours. HbA1C: No results for input(s): HGBA1C in the last 72 hours. CBG: No results for input(s): GLUCAP in the last 168 hours. Lipid Profile: No results for input(s): CHOL, HDL, LDLCALC, TRIG, CHOLHDL, LDLDIRECT in the last 72 hours. Thyroid Function Tests: No results for input(s): TSH, T4TOTAL, FREET4, T3FREE, THYROIDAB in the last 72 hours. Anemia  Panel: No results for input(s): VITAMINB12, FOLATE, FERRITIN, TIBC, IRON, RETICCTPCT in the last 72 hours. Sepsis Labs: No results for input(s): PROCALCITON, LATICACIDVEN in the last 168 hours.  Recent Results (from the past 240 hour(s))  SARS Coronavirus 2 by RT PCR (hospital order, performed in [facility] hospital lab) Nasopharyngeal Nasopharyngeal Swab     Status: None   Collection Time: 04/30/20  2:09 PM   Specimen: Nasopharyngeal Swab  Result Value Ref Range Status   SARS Coronavirus 2 NEGATIVE NEGATIVE Final    Comment: (NOTE) SARS-CoV-2 target nucleic acids are NOT DETECTED.  The SARS-CoV-2 RNA is generally detectable in upper and lower respiratory specimens during the acute phase of infection. The lowest concentration of SARS-CoV-2 viral copies this assay can detect is 250 copies / mL. A negative result does not preclude SARS-CoV-2 infection and should not be used as the sole basis for treatment or other patient management decisions.  A negative result may occur with improper specimen collection / handling, submission of specimen other than nasopharyngeal swab, presence of viral  mutation(s) within the areas targeted by this assay, and inadequate number of viral copies (<250 copies / mL). A negative result must be combined with clinical observations, patient history, and epidemiological information.  Fact Sheet for Patients:   StrictlyIdeas.no  Fact Sheet for Healthcare Providers: BankingDealers.co.za  This test is not yet approved or  cleared by the [place] FDA and has been authorized for detection and/or diagnosis of SARS-CoV-2 by FDA under an Emergency Use Authorization (EUA).  This EUA will remain in effect (meaning this test can be used) for the duration of the COVID-19 declaration under Section 564(b)(1) of the Act, 21 U.S.C. section 360bbb-3(b)(1), unless the authorization is terminated or revoked sooner.  Performed at [facility], [address], [place], [place] [address]   Surgical PCR screen     Status: None   Collection Time: 04/30/20 11:51 PM   Specimen: Nasal Mucosa; Nasal Swab  Result Value Ref Range Status   MRSA, PCR NEGATIVE NEGATIVE Final   Staphylococcus aureus NEGATIVE NEGATIVE Final    Comment: (NOTE) The Xpert SA Assay (FDA approved for NASAL specimens in patients 51 years of age and older), is one component of a comprehensive surveillance program. It is not intended to diagnose infection nor to guide or monitor treatment. Performed at [facility], [address] [place], [place] [address]          Radiology Studies: DG Chest 2 View  Result Date: 04/30/2020 CLINICAL DATA:  Hemoptysis for 1 week EXAM: CHEST - 2 VIEW COMPARISON:  Chest x-ray from April 13, 2020, CT chest from April 27, 2020 FINDINGS: Added density along the LEFT lower lobe, superior segment partially visualized on abdominal CT showing no change and undulating borders on the lateral view. Cardiomediastinal contours are otherwise stable. Increased density in the retrocardiac region also likely due in part to  hiatal hernia. Signs of compression fracture at T12 similar to prior imaging. RIGHT lung remains clear. IMPRESSION: 1. Stable chest.  Without acute changes since April 13, 2020. 2. Known mass at the LEFT hilum with associated volume loss is similar to the prior study 3. Stable hiatal hernia. 4. Signs of compression fracture in the lower thoracic spine as before. Electronically Signed   By: Zetta Bills M.D.   On: 04/30/2020 13:08   MR BRAIN W WO CONTRAST  Addendum Date: 04/30/2020   ADDENDUM REPORT: 04/30/2020 20:25 ADDENDUM: These results were called by telephone  at the time of interpretation on 04/30/2020 at 8:25 pm to provider NP Lang Snow , who verbally acknowledged these results. Electronically Signed   By: Kellie Simmering DO   On: 04/30/2020 20:25   Result Date: 04/30/2020 CLINICAL DATA:  Headache, acute, normal neuro exam. Additional history provided: Left-sided hilar mass, subcarinal and hilar lymph node enlargement, bilateral renal lesions. EXAM: MRI HEAD WITHOUT AND WITH CONTRAST TECHNIQUE: Multiplanar, multiecho pulse sequences of the brain and surrounding structures were obtained without and with intravenous contrast. CONTRAST:  43mL GADAVIST GADOBUTROL 1 MMOL/ML IV SOLN COMPARISON:  No pertinent prior studies available for comparison. 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 These lesions predominately demonstrate peripheral enhancement and corresponding restricted diffusion. These lesions are as follows. Peripherally enhancing, centrally necrotic mass centered within the right basal ganglia measuring 3.5 x 3.4 x 3.3 cm (series 10, image 31). There is prominent surrounding edema within the basal ganglia, also extending into the adjacent white matter tracks, right thalamus, anterior corpus callosum, right temporal lobe and into the midbrain. There is associated mass effect with partial effacement of the lateral and third  ventricles. 9 mm leftward midline shift measured at the level of the septum pellucidum. There is leftward subfalcine herniation. At least 20 additional subcentimeter peripherally enhancing metastases are identified within the supratentorial and infratentorial brain (please see annotations on the axial postcontrast T1 weighted sequence. Some these additional metastatic lesions have mild associated edema. Of note, one of these metastases is present within the posterior midbrain near the cerebral aqueduct (series 10, images 23-24). There is no acute infarct. No evidence of intracranial mass. No chronic intracranial blood products. No extra-axial fluid collection. Vascular: Expected proximal arterial flow voids. Non dominant intracranial right vertebral artery. Skull and upper cervical spine: No focal marrow lesion. Sinuses/Orbits: Visualized orbits show no acute finding. Mild paranasal sinus mucosal thickening, most notably ethmoid. No significant mastoid effusion. IMPRESSION: At least 21 supratentorial and infratentorial enhancing parenchymal lesions are identified. 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 A dominant 3.5 cm lesion centered within the right basal ganglia has prominent surrounding edema. There is associated mass effect with partial effacement of the right lateral and third ventricles, and 9 mm leftward midline shift. Associated leftward subfalcine herniation. The remaining metastases are subcentimeter with some of these lesions having mild associated edema. Of note, one of these lesions is located within the posterior midbrain adjacent to the cerebral aqueduct. No evidence of hydrocephalus at this time. Mild paranasal sinus mucosal thickening. Electronically Signed: By: Kellie Simmering DO On: 04/30/2020 20:03    Scheduled Meds: . citalopram  20 mg Oral Daily  . ferrous sulfate  325 mg Oral Q breakfast  . mupirocin ointment  1 application Nasal BID  .  pantoprazole (PROTONIX) IV  40 mg Intravenous Q24H  . sodium chloride flush  3 mL Intravenous Q12H  . tamsulosin  0.4 mg Oral Daily   Continuous Infusions: . sodium chloride       LOS: 1 day     Georgette Shell, MD  05/01/2020, 11:01 AM

## 2020-05-01 NOTE — Procedures (Signed)
Flexible and EBUS Bronchoscopy Procedure Note  Michael Horton  [id number]  [birth year]  Date:05/01/20  Time:3:26 PM   Provider Performing:Jakayden Cancio C Tamala Julian   Procedure: Flexible bronchoscopy, EBUS Bronchoscopy, Endobronchial cryobiopsy  Indication(s) Lung mass  Consent Risks of the procedure as well as the alternatives and risks of each were explained to the patient and/or caregiver.  Consent for the procedure was obtained.  Anesthesia General Anesthesia   Time Out Verified patient identification, verified procedure, site/side was marked, verified correct patient position, special equipment/implants available, medications/allergies/relevant history reviewed, required imaging and test results available.   Sterile Technique Usual hand hygiene, masks, gowns, and gloves were used   Procedure Description Diagnostic bronchoscope advanced through endotracheal tube and into airway.  Airways were examined down to subsegmental level with findings noted below.  The diagnostic bronchoscope was then removed and the EBUS bronchoscope was advanced into airway with left hilar mass biopsied x 5.  The EBUS bronchoscope was removed after assuring no active bleeding from biopsy site.  Regular scope then advanced into airway and endobronchial cryobiopsy of left endobronchial mass performed.  It was determined LLL could not be opened so procedure ended.  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 Chest X-ray is not needed post procedure.   EBL Minimal   Specimen(s) - EBUS-guided biopsy of left hilar lung mass for slide and cell block - Endobronchial cryobiopsy of left mainstem mass

## 2020-05-02 ENCOUNTER — Encounter (HOSPITAL_COMMUNITY): Admission: RE | Admit: 2020-05-02 | Payer: Medicare HMO | Source: Ambulatory Visit

## 2020-05-02 ENCOUNTER — Ambulatory Visit: Payer: Medicare HMO | Admitting: Radiation Oncology

## 2020-05-02 ENCOUNTER — Ambulatory Visit: Payer: Medicare HMO | Admitting: Cardiology

## 2020-05-02 ENCOUNTER — Ambulatory Visit
Admit: 2020-05-02 | Discharge: 2020-05-02 | Disposition: A | Discharge status: Home / Self Care | Payer: Medicare HMO | Source: Ambulatory Visit | Attending: Radiation Oncology | Admitting: Radiation Oncology

## 2020-05-02 ENCOUNTER — Other Ambulatory Visit (HOSPITAL_COMMUNITY): Payer: Medicare HMO

## 2020-05-02 ENCOUNTER — Ambulatory Visit: Payer: Medicare HMO | Admitting: Cardiovascular Disease

## 2020-05-02 DIAGNOSIS — C3402 Malignant neoplasm of left main bronchus: Secondary | ICD-10-CM | POA: Insufficient documentation

## 2020-05-02 DIAGNOSIS — N2889 Other specified disorders of kidney and ureter: Secondary | ICD-10-CM

## 2020-05-02 DIAGNOSIS — C7931 Secondary malignant neoplasm of brain: Secondary | ICD-10-CM | POA: Insufficient documentation

## 2020-05-02 DIAGNOSIS — Z515 Encounter for palliative care: Secondary | ICD-10-CM

## 2020-05-02 DIAGNOSIS — R531 Weakness: Secondary | ICD-10-CM

## 2020-05-02 DIAGNOSIS — Z66 Do not resuscitate: Secondary | ICD-10-CM

## 2020-05-02 LAB — CBC
HCT: 28.4 % — ABNORMAL LOW (ref 39.0–52.0)
Hemoglobin: 8.5 g/dL — ABNORMAL LOW (ref 13.0–17.0)
MCH: 23.4 pg — ABNORMAL LOW (ref 26.0–34.0)
MCHC: 29.9 g/dL — ABNORMAL LOW (ref 30.0–36.0)
MCV: 78.2 fL — ABNORMAL LOW (ref 80.0–100.0)
Platelets: 409 10*3/uL — ABNORMAL HIGH (ref 150–400)
RBC: 3.63 MIL/uL — ABNORMAL LOW (ref 4.22–5.81)
RDW: 17.8 % — ABNORMAL HIGH (ref 11.5–15.5)
WBC: 9.4 10*3/uL (ref 4.0–10.5)
nRBC: 0 % (ref 0.0–0.2)

## 2020-05-02 LAB — CYTOLOGY - NON PAP

## 2020-05-02 LAB — COMPREHENSIVE METABOLIC PANEL
ALT: 16 U/L (ref 0–44)
AST: 13 U/L — ABNORMAL LOW (ref 15–41)
Albumin: 2.3 g/dL — ABNORMAL LOW (ref 3.5–5.0)
Alkaline Phosphatase: 61 U/L (ref 38–126)
Anion gap: 9 (ref 5–15)
BUN: 15 mg/dL (ref 8–23)
CO2: 24 mmol/L (ref 22–32)
Calcium: 9.4 mg/dL (ref 8.9–10.3)
Chloride: 99 mmol/L (ref 98–111)
Creatinine, Ser: 0.89 mg/dL (ref 0.61–1.24)
GFR calc Af Amer: >60 mL/min (ref >60)
GFR calc non Af Amer: >60 mL/min (ref >60)
Glucose, Bld: 113 mg/dL — ABNORMAL HIGH (ref 70–99)
Potassium: 4.6 mmol/L (ref 3.5–5.1)
Sodium: 132 mmol/L — ABNORMAL LOW (ref 135–145)
Total Bilirubin: 0.1 mg/dL — ABNORMAL LOW (ref 0.3–1.2)
Total Protein: 6 g/dL — ABNORMAL LOW (ref 6.5–8.1)

## 2020-05-02 LAB — TYPE AND SCREEN
ABO/RH(D): O POS
Antibody Screen: NEGATIVE

## 2020-05-02 LAB — SURGICAL PATHOLOGY

## 2020-05-02 MED ORDER — PANTOPRAZOLE SODIUM 40 MG PO TBEC
40.0000 mg | DELAYED_RELEASE_TABLET | Freq: Every day | ORAL | Status: DC
  Administered 2020-05-02 – 2020-05-04 (×3): 40 mg via ORAL
  Filled 2020-05-02 (×3): qty 1

## 2020-05-02 NOTE — Progress Notes (Signed)
PROGRESS NOTE    Michael Horton  LPF:[id number] DOB: 22-May-1946 DOA: 04/30/2020 PCP: Sharion Balloon, FNP    Brief Narrative:Michael Horton is a 74 y.o. male with medical history significant for infrarenal AAA, anxiety/depression, prior tobacco use, GERD, iron deficiency anemia, recent pyelonephritis, and BPH who presented to the ED after having coughed up dark blood with some clots over the last 1 week.  He denies any chest pain, sore throat, epistaxis, or any other issues.  He does not appear to be on blood thinners.  He is scheduled for an abdominal aortic endovascular stent to be placed by Dr. Magdalene Molly on 7/15 as well.  He has had recent pyelonephritis that was treated by his PCP and has had a recent CT chest/abdomen/pelvis performed demonstrating a significant left sided hilar mass as well as left and right kidney mass.  He denies any fevers or chills or sick contacts.  He has apparently lost approximately 15 pounds unintentionally in the last several weeks. In ED: Stable vital signs noted and leukocytosis of 13,500 noted as well as sodium 133.  Hemoglobin is 9.7 compared to approximately 12, 1 month ago.  2 view chest x-ray with finding of large left hilar mass and noted chronic thoracic compression fracture.  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 Transfer to [facility] for further evaluation and workup with oncology team [facility]) as well as radiation oncology  Bilateral kidney masses  Oncology following as above, concern for metastatic disease is high.  Multiple brain lesions MRI brain 04/30/2020 -At least 21 supratentorial and infratentorial  enhancing parenchymal lesions are identified. -Follow-up with oncology, radiation oncology -Continue Decadron  Infrarenal aortic aneurysm - followed by Dr. Donzetta Matters for endovascular repair on 05/10/2020.   - Measuring 6.8 x 6.6 cm.  Anxiety and depression - continue citalopram  BPH continue Flomax  Tobacco abuse refuses nicotine patch  GERD continue PPI  Mild hypovolemic hyponatremia -Continue to encourage p.o. intake, IV fluids as necessary Lab Results  Component Value Date   NA 132 (L) 05/02/2020   K 4.6 05/02/2020   CL 99 05/02/2020   CO2 24 05/02/2020   Leukocytosis, likely leukemoid reaction given above  - On steroids - no clear evidence of infection noted. -No indication for antibiotics at this time   Estimated body mass index is 22.1 kg/m as calculated from the following:   Height as of this encounter: 5\' 10"  (1.778 m).   Weight as of this encounter: 69.9 kg.  DVT prophylaxis: SCD Code Status: DNR Family Communication: None at bedside Disposition Plan:  Status is: Inpatient  Dispo: The patient is from: Home              Anticipated d/c is to: Home              Anticipated d/c date is: Unknown.  Patient has widespread what appears to be high likelihood of metastatic lesions. Patient and wife want to talk about it and decide how aggressive they want treatments to be.              Patient currently is not medically stable to d/c.  Patient was transferred to [facility] from [facility] for hemoptysis with lung mass for bronc today New findings of brain lesions by MRI 04/30/2020    Consultants:   PCCM  Procedures: None Antimicrobials: None  Subjective: Patient resting in bed denies any further hemoptysis; denies nausea, vomiting, diarrhea, respiration, headache, fevers, chills.  Objective: Vitals:   05/02/20 0217 05/02/20 0233 05/02/20 0406 05/02/20 0407  BP: 114/76 113/72 118/78 118/78  Pulse: 64 62 60 60  Resp: 16 17 18 18   Temp: (!) 97.4 F (36.3 C) 98.1 F  (36.7 C) 98.1 F (36.7 C) 98.2 F (36.8 C)  TempSrc: Oral Oral Oral Oral  SpO2: 96% 94% 100% 96%  Weight:      Height:        Intake/Output Summary (Last 24 hours) at 05/02/2020 0805 Last data filed at 05/02/2020 0408 Gross per 24 hour  Intake 1667.88 ml  Output 210 ml  Net 1457.88 ml   Filed Weights   04/30/20 1126 04/30/20 1743 05/01/20 1413  Weight: 72.1 kg 69.4 kg 69.9 kg    Examination:  General exam: Appears calm and comfortable  Respiratory system: Clear to auscultation. Respiratory effort normal. Cardiovascular system: S1 & S2 heard, RRR. No JVD, murmurs, rubs, gallops or clicks. No pedal edema. Gastrointestinal system: Abdomen is nondistended, soft and nontender. No organomegaly or masses felt. Normal bowel sounds heard. Central nervous system: Alert and oriented. No focal neurological deficits. Extremities: Symmetric 5 x 5 power. Skin: No rashes, lesions or ulcers Psychiatry: Judgement and insight appear normal. Mood & affect appropriate.     Data Reviewed: I have personally reviewed following labs and imaging studies  CBC: Recent Labs  Lab 04/27/20 0852 04/30/20 1153 04/30/20 2100 05/02/20 0320  WBC 15.0* 13.5* 14.4* 9.4  NEUTROABS 12.1* 11.0*  --   --   HGB 10.5* 9.7* 9.3* 8.5*  HCT 34.8* 32.7* 30.3* 28.4*  MCV 78.7* 79.0* 78.1* 78.2*  PLT 522* 490* 462* 431*   Basic Metabolic Panel: Recent Labs  Lab 04/27/20 1033 04/30/20 1153 04/30/20 2100 05/02/20 0320  NA  --  133* 131* 132*  K  --  4.3 3.9 4.6  CL  --  94* 95* 99  CO2  --  26 24 24   GLUCOSE  --  96 139* 113*  BUN  --  13 12 15   CREATININE 1.00 0.96 1.13 0.89  CALCIUM  --  10.6* 9.7 9.4   GFR: Estimated Creatinine Clearance: 73.1 mL/min (by C-G formula based on SCr of 0.89 mg/dL). Liver Function Tests: Recent Labs  Lab 04/30/20 1153 04/30/20 2100 05/02/20 0320  AST 12* 16 13*  ALT 13 14 16   ALKPHOS 75 73 61  BILITOT 0.5 0.4 0.1*  PROT 7.7 6.7 6.0*  ALBUMIN 3.2* 2.6* 2.3*    No results for input(s): LIPASE, AMYLASE in the last 168 hours. No results for input(s): AMMONIA in the last 168 hours. Coagulation Profile: Recent Labs  Lab 04/30/20 1153 04/30/20 2100  INR 1.0 1.0   Cardiac Enzymes: No results for input(s): CKTOTAL, CKMB, CKMBINDEX, TROPONINI in the last 168 hours. BNP (last 3 results) No results for input(s): PROBNP in the last 8760 hours. HbA1C: No results for input(s): HGBA1C in the last 72 hours. CBG: No results for input(s): GLUCAP in the last 168 hours. Lipid Profile: No results for input(s): CHOL, HDL, LDLCALC, TRIG, CHOLHDL, LDLDIRECT in the last 72 hours. Thyroid Function Tests: No results for input(s): TSH, T4TOTAL, FREET4, T3FREE, THYROIDAB in the last 72  hours. Anemia Panel: No results for input(s): VITAMINB12, FOLATE, FERRITIN, TIBC, IRON, RETICCTPCT in the last 72 hours. Sepsis Labs: No results for input(s): PROCALCITON, LATICACIDVEN in the last 168 hours.  Recent Results (from the past 240 hour(s))  SARS Coronavirus 2 by RT PCR (hospital order, performed in [facility] hospital lab) Nasopharyngeal Nasopharyngeal Swab     Status: None   Collection Time: 04/30/20  2:09 PM   Specimen: Nasopharyngeal Swab  Result Value Ref Range Status   SARS Coronavirus 2 NEGATIVE NEGATIVE Final    Comment: (NOTE) SARS-CoV-2 target nucleic acids are NOT DETECTED.  The SARS-CoV-2 RNA is generally detectable in upper and lower respiratory specimens during the acute phase of infection. The lowest concentration of SARS-CoV-2 viral copies this assay can detect is 250 copies / mL. A negative result does not preclude SARS-CoV-2 infection and should not be used as the sole basis for treatment or other patient management decisions.  A negative result may occur with improper specimen collection / handling, submission of specimen other than nasopharyngeal swab, presence of viral mutation(s) within the areas targeted by this assay, and inadequate  number of viral copies (<250 copies / mL). A negative result must be combined with clinical observations, patient history, and epidemiological information.  Fact Sheet for Patients:   StrictlyIdeas.no  Fact Sheet for Healthcare Providers: BankingDealers.co.za  This test is not yet approved or  cleared by the [place] FDA and has been authorized for detection and/or diagnosis of SARS-CoV-2 by FDA under an Emergency Use Authorization (EUA).  This EUA will remain in effect (meaning this test can be used) for the duration of the COVID-19 declaration under Section 564(b)(1) of the Act, 21 U.S.C. section 360bbb-3(b)(1), unless the authorization is terminated or revoked sooner.  Performed at [facility], [address] [place], [place] [address]   Surgical PCR screen     Status: None   Collection Time: 04/30/20 11:51 PM   Specimen: Nasal Mucosa; Nasal Swab  Result Value Ref Range Status   MRSA, PCR NEGATIVE NEGATIVE Final   Staphylococcus aureus NEGATIVE NEGATIVE Final    Comment: (NOTE) The Xpert SA Assay (FDA approved for NASAL specimens in patients 74 years of age and older), is one component of a comprehensive surveillance program. It is not intended to diagnose infection nor to guide or monitor treatment. Performed at [facility], [place] [address], [place], [place] [address]          Radiology Studies: DG Chest 2 View  Result Date: 04/30/2020 CLINICAL DATA:  Hemoptysis for 1 week EXAM: CHEST - 2 VIEW COMPARISON:  Chest x-ray from April 13, 2020, CT chest from April 27, 2020 FINDINGS: Added density along the LEFT lower lobe, superior segment partially visualized on abdominal CT showing no change and undulating borders on the lateral view. Cardiomediastinal contours are otherwise stable. Increased density in the retrocardiac region also likely due in part to hiatal hernia. Signs of compression fracture at T12 similar to prior  imaging. RIGHT lung remains clear. IMPRESSION: 1. Stable chest.  Without acute changes since April 13, 2020. 2. Known mass at the LEFT hilum with associated volume loss is similar to the prior study 3. Stable hiatal hernia. 4. Signs of compression fracture in the lower thoracic spine as before. Electronically Signed   By: Zetta Bills M.D.   On: 04/30/2020 13:08   MR BRAIN W WO CONTRAST  Addendum Date: 04/30/2020   ADDENDUM REPORT: 04/30/2020 20:25 ADDENDUM: These results were called  by telephone at the time of interpretation on 04/30/2020 at 8:25 pm to provider NP Lang Snow , who verbally acknowledged these results. Electronically Signed   By: Kellie Simmering DO   On: 04/30/2020 20:25   Result Date: 04/30/2020 CLINICAL DATA:  Headache, acute, normal neuro exam. Additional history provided: Left-sided hilar mass, subcarinal and hilar lymph node enlargement, bilateral renal lesions. EXAM: MRI HEAD WITHOUT AND WITH CONTRAST TECHNIQUE: Multiplanar, multiecho pulse sequences of the brain and surrounding structures were obtained without and with intravenous contrast. CONTRAST:  79mL GADAVIST GADOBUTROL 1 MMOL/ML IV SOLN COMPARISON:  No pertinent prior studies available for comparison. 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 These lesions predominately demonstrate peripheral enhancement and corresponding restricted diffusion. These lesions are as follows. Peripherally enhancing, centrally necrotic mass centered within the right basal ganglia measuring 3.5 x 3.4 x 3.3 cm (series 10, image 31). There is prominent surrounding edema within the basal ganglia, also extending into the adjacent white matter tracks, right thalamus, anterior corpus callosum, right temporal lobe and into the midbrain. There is associated mass effect with partial effacement of the lateral and third ventricles. 9 mm leftward midline shift measured at the level of the septum  pellucidum. There is leftward subfalcine herniation. At least 20 additional subcentimeter peripherally enhancing metastases are identified within the supratentorial and infratentorial brain (please see annotations on the axial postcontrast T1 weighted sequence. Some these additional metastatic lesions have mild associated edema. Of note, one of these metastases is present within the posterior midbrain near the cerebral aqueduct (series 10, images 23-24). There is no acute infarct. No evidence of intracranial mass. No chronic intracranial blood products. No extra-axial fluid collection. Vascular: Expected proximal arterial flow voids. Non dominant intracranial right vertebral artery. Skull and upper cervical spine: No focal marrow lesion. Sinuses/Orbits: Visualized orbits show no acute finding. Mild paranasal sinus mucosal thickening, most notably ethmoid. No significant mastoid effusion. IMPRESSION: At least 21 supratentorial and infratentorial enhancing parenchymal lesions are identified. 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 A dominant 3.5 cm lesion centered within the right basal ganglia has prominent surrounding edema. There is associated mass effect with partial effacement of the right lateral and third ventricles, and 9 mm leftward midline shift. Associated leftward subfalcine herniation. The remaining metastases are subcentimeter with some of these lesions having mild associated edema. Of note, one of these lesions is located within the posterior midbrain adjacent to the cerebral aqueduct. No evidence of hydrocephalus at this time. Mild paranasal sinus mucosal thickening. Electronically Signed: By: Kellie Simmering DO On: 04/30/2020 20:03    Scheduled Meds: . citalopram  20 mg Oral Daily  . dexamethasone (DECADRON) injection  4 mg Intravenous Q6H  . feeding supplement (ENSURE ENLIVE)  237 mL Oral TID BM  . ferrous sulfate  325 mg Oral Q breakfast  .  multivitamin with minerals  1 tablet Oral Daily  . mupirocin ointment  1 application Nasal BID  . pantoprazole  40 mg Oral Daily  . sodium chloride flush  3 mL Intravenous Q12H  . tamsulosin  0.4 mg Oral Daily   Continuous Infusions: . sodium chloride    . sodium chloride 75 mL/hr at 05/01/20 2102     LOS: 2 days   Little Ishikawa, DO  05/02/2020, 8:05 AM

## 2020-05-02 NOTE — Anesthesia Postprocedure Evaluation (Signed)
Anesthesia Post Note  Patient: Michael Horton  Procedure(s) Performed: VIDEO BRONCHOSCOPY (Left ) ENDOBRONCHIAL ULTRASOUND (Left ) BRONCHIAL NEEDLE ASPIRATION BIOPSIES CRYOTHERAPY     Patient location during evaluation: PACU Anesthesia Type: General Level of consciousness: awake and alert Pain management: pain level controlled Vital Signs Assessment: post-procedure vital signs reviewed and stable Respiratory status: spontaneous breathing, nonlabored ventilation and respiratory function stable Cardiovascular status: blood pressure returned to baseline and stable Postop Assessment: no apparent nausea or vomiting Anesthetic complications: no   No complications documented.  Last Vitals:  Vitals:   05/02/20 0406 05/02/20 0407  BP: 118/78 118/78  Pulse: 60 60  Resp: 18 18  Temp: 36.7 C 36.8 C  SpO2: 100% 96%    Last Pain:  Vitals:   05/02/20 0734  TempSrc:   PainSc: 0-No pain                 Audry Pili

## 2020-05-02 NOTE — Progress Notes (Signed)
PIV consult: Arrived to unit, pt being transported for procedure at [facility]. RN will re-enter consult if needed upon return.

## 2020-05-02 NOTE — Progress Notes (Signed)
Report called and given to Tracy, Therapist, sports at [facility]. All questions answered to satisfaction. Awaiting [facility] for transport.

## 2020-05-02 NOTE — Consult Note (Signed)
Consultation Note Date: 05/02/2020   Patient Name: Michael Horton  DOB: 12-Aug-1946  MRN: [id number]  Age / Sex: 74 y.o., male  PCP: Sharion Balloon, FNP Referring Physician: Little Ishikawa, MD  Reason for Consultation: Establishing goals of care and Psychosocial/spiritual support  HPI/Patient Profile: 74 y.o. male  admitted on 04/30/2020 with  PMH of tobacco use,  AAA, anxiety/depression, recent pyelonephritis, BPH, who presented to [place] 7/5 with c/o of  hemoptysis.   Hemoptysis began 1 week prior to presentation and has worsened, now with presence of larger blood clots.  Reported unintentional weight loss over the last 2 months.  Patient reports fatigue and weakness over the past several months.   Recent CT  revealed significant L sided hilar mass, subcarinal and hilar lymph node enlargement, as well as bilateral renal lesions.   He has seen Dr. Delton Coombes with Oncology for initial eval of renal mass (04/02/20) but has not seen in follow up since most recent  CT a few days ago.  Patient to be transferred to [facility] where pulmonary will consult given recent CT findings for evaluation and treatment.   Patient and family face treatment option decisions, advanced directive decisions and anticipatory care needs.     Clinical Assessment and Goals of Care:   This NP Wadie Lessen reviewed medical records, received report from team, assessed the patient and then meet at the patient's bedside with his wife/Michael Horton and sister-in-law/Michael Horton to discuss diagnosis, prognosis, GOC, EOL wishes disposition and options.   Concept of Palliative Care was introduced as specialized medical care for people and their families living with serious illness.  If focuses on providing relief from the symptoms and stress of a serious illness.  The goal is to improve quality of life for both the patient and the family.  Created space and  opportunity for patient family to explore their thoughts and feelings regarding current medical situation.  All verbalized understanding regarding the seriousness of the current year and medical situation acknowledged that this is an incurable cancer.   Education was offered today regarding advanced directives.  Concepts specific to code status, artifical feeding and hydration, continued IV antibiotics and rehospitalization was had.  The difference between a aggressive medical intervention path  and a palliative comfort care path for this patient at this time was had.  Values and goals of care important to patient and family were attempted to be elicited.   MOST form  introduced   Questions and concerns addressed.  Patient  encouraged to call with questions or concerns.     PMT will continue to support holistically.    No documented healthcare power of attorney or advanced directive.  Education offered that spiritual care department would help with documentation of both while he is here in the hospital.    SUMMARY OF RECOMMENDATIONS    Code Status/Advance Care Planning:  DNR   No artificial feeding now or in the future   Palliative Prophylaxis:   Bowel Regimen, Delirium Protocol, Frequent Pain Assessment and Oral  Care  Additional Recommendations (Limitations, Scope, Preferences):  Full Scope Treatment   Patient wishes to proceed with radiation as planned and consult with medical oncology for consideration of systemic therapy  Psycho-social/Spiritual:   Desire for further Chaplaincy support:yes  Additional Recommendations: Education on Hospice  Prognosis:   Unable to determine, will depend on desire for life prolonging measures  Discharge Planning: To Be Determined      Primary Diagnoses: Present on Admission:  Hemoptysis   I have reviewed the medical record, interviewed the patient and family, and examined the patient. The following aspects are pertinent.  Past  Medical History:  Diagnosis Date   AAA (abdominal aortic aneurysm) (HCC)    Anxiety    Cancer ([place])    LUNG CANCER   Depression    GERD (gastroesophageal reflux disease)    Hemoptysis 04/2020   Hyperlipidemia    Social History   Socioeconomic History   Marital status: Married    Spouse name: Michael Horton   Number of children: 3   Years of education: Not on file   Highest education level: 11th grade  Occupational History   Occupation: RETIRED  Tobacco Use   Smoking status: Former Smoker    Packs/day: 0.50    Years: 56.00    Pack years: 28.00    Types: Cigarettes    Quit date: 03/21/2020    Years since quitting: 0.1   Smokeless tobacco: Never Used   Tobacco comment: quit 03/21/2020  Vaping Use   Vaping Use: Never used  Substance and Sexual Activity   Alcohol use: No    Comment: rare   Drug use: No   Sexual activity: Yes    Birth control/protection: None  Other Topics Concern   Not on file  Social History Narrative   Not on file   Social Determinants of Health   Financial Resource Strain: Low Risk    Difficulty of Paying Living Expenses: Not hard at all  Food Insecurity: No Food Insecurity   Worried About Charity fundraiser in the Last Year: Never true   [place] in the Last Year: Never true  Transportation Needs: No Transportation Needs   Lack of Transportation (Medical): No   Lack of Transportation (Non-Medical): No  Physical Activity: Inactive   Days of Exercise per Week: 0 days   Minutes of Exercise per Session: 0 min  Stress: Stress Concern Present   Feeling of Stress : To some extent  Social Connections: Moderately Isolated   Frequency of Communication with Friends and Family: Once a week   Frequency of Social Gatherings with Friends and Family: Once a week   Attends Religious Services: 1 to 4 times per year   Active Member of [employer] or Organizations: No   Attends Music therapist: Never   Marital  Status: Married   Family History  Problem Relation Age of Onset   Dementia Mother    Alzheimer's disease Mother    Hypertension Mother    Heart disease Father    Glaucoma Father    Lung cancer Paternal Uncle    Throat cancer Paternal Grandfather    Heart disease Brother    Arthritis Brother        back issues    Lymphoma Sister    Colon cancer Neg Hx    Esophageal cancer Neg Hx    Stomach cancer Neg Hx    Colon polyps Neg Hx    Scheduled Meds:  citalopram  20 mg Oral  Daily   dexamethasone (DECADRON) injection  4 mg Intravenous Q6H   feeding supplement (ENSURE ENLIVE)  237 mL Oral TID BM   ferrous sulfate  325 mg Oral Q breakfast   multivitamin with minerals  1 tablet Oral Daily   mupirocin ointment  1 application Nasal BID   pantoprazole  40 mg Oral Daily   sodium chloride flush  3 mL Intravenous Q12H   tamsulosin  0.4 mg Oral Daily   Continuous Infusions:  sodium chloride     sodium chloride 75 mL/hr at 05/02/20 0824   PRN Meds:.sodium chloride, acetaminophen **OR** acetaminophen, dicyclomine, diphenhydrAMINE, docusate sodium, nicotine polacrilex, ondansetron **OR** ondansetron (ZOFRAN) IV, sodium chloride flush Medications Prior to Admission:  Prior to Admission medications   Medication Sig Start Date End Date Taking? Authorizing Provider  acetaminophen (TYLENOL) 650 MG CR tablet Take 650 mg by mouth every 8 (eight) hours as needed for pain.   Yes [provider]  citalopram (CELEXA) 40 MG tablet Take 1 tablet (40 mg total) by mouth daily. Patient taking differently: Take 20 mg by mouth daily.  01/13/20  Yes Hawks, Christy A, FNP  dicyclomine (BENTYL) 20 MG tablet Take 1 tablet (20 mg total) by mouth 4 (four) times daily as needed for spasms. Patient taking differently: Take 10 mg by mouth in the morning and at bedtime.  04/10/20  Yes Hawks, Christy A, FNP  diphenhydramine-acetaminophen (TYLENOL PM) 25-500 MG TABS tablet Take 1 tablet by  mouth at bedtime as needed (sleep).   Yes [provider]  docusate sodium (COLACE) 100 MG capsule Take 100 mg by mouth daily as needed for mild constipation.   Yes [provider]  Ensure (ENSURE) Take 1 Can by mouth daily.   Yes [provider]  ferrous sulfate 325 (65 FE) MG tablet Take 325 mg by mouth daily with breakfast.   Yes [provider]  Misc Natural Products ([place]) Take 2 capsules by mouth daily.   Yes [provider]  Multiple Vitamin (MULTI VITAMIN DAILY PO) Take 1 tablet by mouth daily.    Yes [provider]  nicotine polacrilex (NICORETTE) 4 MG gum Take 4 mg by mouth as needed for smoking cessation.   Yes [provider]  omeprazole (PRILOSEC) 20 MG capsule Take 1 capsule (20 mg total) by mouth 2 (two) times daily before a meal. Patient taking differently: Take 20 mg by mouth daily.  03/01/20  Yes Hawks, Christy A, FNP  ondansetron (ZOFRAN) 4 MG tablet Take 1 tablet (4 mg total) by mouth every 8 (eight) hours as needed for nausea or vomiting. 04/10/20  Yes Sharion Balloon, FNP  PRESCRIPTION MEDICATION Place 1 application rectally daily as needed (hemorrhoids). Hydrocortisone 2.5 % Lidocaine 5% Phenylephrine 0.25% Pramoxine 1% rectal cream   Yes [provider]  tamsulosin (FLOMAX) 0.4 MG CAPS capsule Take 0.4 mg by mouth daily. Patient not taking: Reported on 04/30/2020    [provider]   No Known Allergies Review of Systems  Neurological: Positive for weakness.    Physical Exam Constitutional:      Appearance: He is underweight. He is ill-appearing.  Cardiovascular:     Rate and Rhythm: Normal rate.  Pulmonary:     Effort: Pulmonary effort is normal.  Musculoskeletal:     Comments: -Generalized weakness  Skin:    General: Skin is warm and dry.  Neurological:     Mental Status: He is alert.  Vital Signs: BP 118/78 (BP Location: Right Arm)    Pulse 60     Temp 98.2 F (36.8 C) (Oral)    Resp 18    Ht 5\' 10"  (1.778 m)    Wt 69.9 kg    SpO2 96%    BMI 22.10 kg/m  Pain Scale: 0-10 POSS *See Group Information*: 1-Acceptable,Awake and alert Pain Score: 0-No pain   SpO2: SpO2: 96 % O2 Device:SpO2: 96 % O2 Flow Rate: .O2 Flow Rate (L/min): 8 L/min  IO: Intake/output summary:   Intake/Output Summary (Last 24 hours) at 05/02/2020 1020 Last data filed at 05/02/2020 8367 Gross per 24 hour  Intake 2127.88 ml  Output 210 ml  Net 1917.88 ml    LBM: Last BM Date: 05/01/20 Baseline Weight: Weight: 72.1 kg Most recent weight: Weight: 69.9 kg     Palliative Assessment/Data:  70%    Discussed with Dr. Avon Gully  PMT will f/u in the morning for a scheduled 9:00 meeting  Time In: 1000 Time Out: 1110 Time Total: 70 minutes Greater than 50%  of this time was spent counseling and coordinating care related to the above assessment and plan.  Signed by: Wadie Lessen, NP   Please contact Palliative Medicine Team phone at [phone] for questions and concerns.  For individual provider: See Shea Evans

## 2020-05-02 NOTE — Progress Notes (Signed)
   Patient plans for transfer to [facility] for oncology evaluation.  I have discussed with the patient and his family that we will put his aneurysm on the back burner at this time and he can follow-up on an as-needed basis to discuss surgical intervention if and when this becomes a priority.  Ziah Leandro C. Donzetta Matters, MD [facility] Office: [phone] Pager: [phone]

## 2020-05-02 NOTE — Progress Notes (Addendum)
05/02/2020  Pulm f/u note  S: Seen in f/u for metastatic cancer. States he feels pretty good this am. Minimal hemoptysis.  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, brain, kidneys - see note yesterday - spoke to patient and wife at length today that at the very least palliative radiation is warranted to limit terminal symptoms from brain metastases and endobronchial mass -  They want to know pathology and potential chemo options before agreeing to concomittant chemo - they are agreeable to transfer to [facility] to expedite radiation mapping and starting therapy - I have reached out to Radonc Tammi Klippel), MedOnc Julien Nordmann) and primary Avon Gully) regarding, appreciate help for everyone involved  Erskine Emery MD PCCM

## 2020-05-03 ENCOUNTER — Encounter (HOSPITAL_COMMUNITY): Payer: Self-pay | Admitting: Internal Medicine

## 2020-05-03 ENCOUNTER — Encounter (HOSPITAL_COMMUNITY): Admission: RE | Payer: Self-pay | Source: Home / Self Care

## 2020-05-03 ENCOUNTER — Ambulatory Visit (HOSPITAL_COMMUNITY): Admission: RE | Admit: 2020-05-03 | Payer: Medicare HMO | Source: Home / Self Care | Admitting: Internal Medicine

## 2020-05-03 ENCOUNTER — Telehealth: Payer: Self-pay | Admitting: *Deleted

## 2020-05-03 ENCOUNTER — Ambulatory Visit
Admit: 2020-05-03 | Discharge: 2020-05-03 | Disposition: A | Discharge status: Home / Self Care | Payer: Medicare HMO | Attending: Radiation Oncology | Admitting: Radiation Oncology

## 2020-05-03 DIAGNOSIS — Z66 Do not resuscitate: Secondary | ICD-10-CM

## 2020-05-03 DIAGNOSIS — Z515 Encounter for palliative care: Secondary | ICD-10-CM

## 2020-05-03 DIAGNOSIS — R531 Weakness: Secondary | ICD-10-CM

## 2020-05-03 LAB — METHYLMALONIC ACID, SERUM: Methylmalonic Acid, Quantitative: 257 nmol/L (ref 0–378)

## 2020-05-03 SURGERY — COLONOSCOPY WITH PROPOFOL
Anesthesia: Monitor Anesthesia Care

## 2020-05-03 MED ORDER — TRAMADOL HCL 50 MG PO TABS
50.0000 mg | ORAL_TABLET | Freq: Four times a day (QID) | ORAL | Status: DC | PRN
  Administered 2020-05-03 – 2020-05-04 (×2): 50 mg via ORAL
  Filled 2020-05-03 (×2): qty 1

## 2020-05-03 NOTE — Telephone Encounter (Signed)
I spoke with Michael Horton today.  She and her husband would like patient to get treatment here at the cancer center.  Dr. Julien Nordmann and new patient coordinator are updated.

## 2020-05-03 NOTE — Progress Notes (Signed)
[facility] PCCM  Transfer from [facility] to [facility] for XRT. Diagnosed with metastatic squamous cell carcinoma of the lung this week PCCM available prn  Roselie Awkward, MD [place] PCCM Pager: [phone] Cell: [phone] If no response, call [phone]

## 2020-05-03 NOTE — Progress Notes (Signed)
PROGRESS NOTE    Michael Horton  PYK:[id number] DOB: July 29, 1946 DOA: 04/30/2020 PCP: Sharion Balloon, FNP   Chef Complaints: Hemoptysis  Brief Narrative: 74 y.o.malewith medical history significant forinfrarenal AAA, anxiety/depression, prior tobacco use, GERD, iron deficiency anemia, recent pyelonephritis,and BPH who presented to the ED after having coughed up dark blood with some clots over the last 1 week. He denies any chest pain, sore throat, epistaxis, or any other issues. He does not appear to be on blood thinners. He is scheduled for an abdominal aortic endovascular stent to be placed by Dr. Magdalene Molly on 7/15 as well. He has had recent pyelonephritis that was treated by his PCP and has had a recent CT chest/abdomen/pelvis performed demonstrating a significant left sided hilar mass as well as left and right kidney mass. He denies any fevers or chills or sick contacts. He has apparently lost approximately 15 pounds unintentionally in the last several weeks. In NL:ZJQBHA vital signs noted and leukocytosis of 13,500 noted as well as sodium 133. Hemoglobin is 9.7 compared to approximately 12,1 month ago. 2 view chest x-ray with finding of large left hilar mass and noted chronic thoracic compression fracture. Status post bronchoscopy Status post bronchoscopy, pulmonary eval.  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 7/7- tx to [facility].  Subjective: Seen this morning pain is controlled.  Patient is alert awake.  Feeling better at the bedside.  Radiation therapy planned today.  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  Status post bronchoscopy, pulmonary eval.  Pathology came on 7/6 reported  as invasive squamous cell carcinoma.Patient has bilateral kidney masses concerning for metastatic disease, multiple brain lesion and MRI 7/5.  Patient is on Decadron.   Transferred to [facility] for expedited radiation evaluation. He is for radiation therapy today.  I did talk with Dr. Julien Nordmann who may be able to see him while he is here.  Multiple brain lesions seen in the MRI.On Decadron.  No headache no other symptoms  Leukocytosis likely leukemoid reaction in the setting of #1 on a steroid as well.  Afebrile no evidence of infection.  Infrarenal aortic aneurysm endovascular followed by Dr. Gilman Buttner is going to follow-up on as-needed basis and he is keeping the aneurysm on the back burner at this time given the #1  Mild hyponatremia monitor  Anemia with hemoglobin 8.5 g suspect from chronic disease from malignancy.  Will monitor transfuse if less than 7 g or symptomatic.  Continue iron supplementation  BPH on Flomax  Tobacco abuse declined nicotine breath  GERD cont ppi  Anxiety/depression on Celexa   DVT prophylaxis: SCDs Start: 04/30/20 1516 Code Status: DNRFamily Communication: plan of care discussed with patient and multiple family members at bedside.  Status is: Inpatient  Remains inpatient appropriate because:IV treatments appropriate due to intensity of illness or inability to take PO and Inpatient level of care appropriate due to severity of illness   Dispo: The patient is from: Home              Anticipated d/c is to: Home              Anticipated d/c date is: 1 day              Patient currently is not medically stable to d/c.   Nutrition: Diet Order            Diet  regular Room service appropriate? Yes; Fluid consistency: Thin  Diet effective now                 Nutrition Problem: Increased nutrient needs Etiology: chronic illness Signs/Symptoms: estimated needs Interventions: Ensure Enlive (each supplement provides 350kcal and 20 grams of protein), MVI Body mass index is 22.21 kg/m.  Consultants:see note  Procedures:see note Microbiology:see note  Medications: Scheduled Meds: . citalopram  20 mg Oral Daily  .  dexamethasone (DECADRON) injection  4 mg Intravenous Q6H  . feeding supplement (ENSURE ENLIVE)  237 mL Oral TID BM  . ferrous sulfate  325 mg Oral Q breakfast  . multivitamin with minerals  1 tablet Oral Daily  . mupirocin ointment  1 application Nasal BID  . pantoprazole  40 mg Oral Daily  . sodium chloride flush  3 mL Intravenous Q12H  . tamsulosin  0.4 mg Oral Daily   Continuous Infusions: . sodium chloride    . sodium chloride 75 mL/hr at 05/03/20 6010    Antimicrobials: Anti-infectives (From admission, onward)   None       Objective: Vitals: Today's Vitals   05/02/20 2338 05/03/20 0230 05/03/20 0355 05/03/20 0616  BP:  137/88  (!) 165/95  Pulse:  74  (!) 57  Resp:  14  16  Temp:  97.9 F (36.6 C)  (!) 97.3 F (36.3 C)  TempSrc:  Oral  Oral  SpO2:  96%  100%  Weight:      Height:      PainSc: 0-No pain  0-No pain     Intake/Output Summary (Last 24 hours) at 05/03/2020 0809 Last data filed at 05/03/2020 0700 Gross per 24 hour  Intake 1582.05 ml  Output --  Net 1582.05 ml   Filed Weights   04/30/20 1743 05/01/20 1413 05/02/20 1437  Weight: 69.4 kg 69.9 kg 70.2 kg   Weight change: 0.363 kg   Intake/Output from previous day: 07/07 0701 - 07/08 0700 In: 1582.1 [P.O.:840; I.V.:742.1] Out: -  Intake/Output this shift: No intake/output data recorded.  Examination:  General exam: AAOX3, pleasant, not in distress. HEENT:Oral mucosa moist, Ear/Nose WNL grossly,dentition normal. Respiratory system: bilaterally clear,no wheezing or crackles,no use of accessory muscle, non tender. Cardiovascular system: S1 & S2 +, regular, No JVD. Gastrointestinal system: Abdomen soft, NT,ND, BS+. Nervous System:Alert, awake, moving extremities and grossly nonfocal Extremities: No edema, distal peripheral pulses palpable.  Skin: No rashes,no icterus. MSK: Normal muscle bulk,tone, power  Data Reviewed: I have personally reviewed following labs and imaging studies CBC: Recent  Labs  Lab 04/27/20 0852 04/30/20 1153 04/30/20 2100 05/02/20 0320  WBC 15.0* 13.5* 14.4* 9.4  NEUTROABS 12.1* 11.0*  --   --   HGB 10.5* 9.7* 9.3* 8.5*  HCT 34.8* 32.7* 30.3* 28.4*  MCV 78.7* 79.0* 78.1* 78.2*  PLT 522* 490* 462* 932*   Basic Metabolic Panel: Recent Labs  Lab 04/27/20 1033 04/30/20 1153 04/30/20 2100 05/02/20 0320  NA  --  133* 131* 132*  K  --  4.3 3.9 4.6  CL  --  94* 95* 99  CO2  --  26 24 24   GLUCOSE  --  96 139* 113*  BUN  --  13 12 15   CREATININE 1.00 0.96 1.13 0.89  CALCIUM  --  10.6* 9.7 9.4   GFR: Estimated Creatinine Clearance: 73.4 mL/min (by C-G formula based on SCr of 0.89 mg/dL). Liver Function Tests: Recent Labs  Lab 04/30/20 1153 04/30/20 2100 05/02/20 0320  AST 12*  16 13*  ALT 13 14 16   ALKPHOS 75 73 61  BILITOT 0.5 0.4 0.1*  PROT 7.7 6.7 6.0*  ALBUMIN 3.2* 2.6* 2.3*   No results for input(s): LIPASE, AMYLASE in the last 168 hours. No results for input(s): AMMONIA in the last 168 hours. Coagulation Profile: Recent Labs  Lab 04/30/20 1153 04/30/20 2100  INR 1.0 1.0   Cardiac Enzymes: No results for input(s): CKTOTAL, CKMB, CKMBINDEX, TROPONINI in the last 168 hours. BNP (last 3 results) No results for input(s): PROBNP in the last 8760 hours. HbA1C: No results for input(s): HGBA1C in the last 72 hours. CBG: No results for input(s): GLUCAP in the last 168 hours. Lipid Profile: No results for input(s): CHOL, HDL, LDLCALC, TRIG, CHOLHDL, LDLDIRECT in the last 72 hours. Thyroid Function Tests: No results for input(s): TSH, T4TOTAL, FREET4, T3FREE, THYROIDAB in the last 72 hours. Anemia Panel: No results for input(s): VITAMINB12, FOLATE, FERRITIN, TIBC, IRON, RETICCTPCT in the last 72 hours. Sepsis Labs: No results for input(s): PROCALCITON, LATICACIDVEN in the last 168 hours.  Recent Results (from the past 240 hour(s))  SARS Coronavirus 2 by RT PCR (hospital order, performed in [facility] hospital lab) Nasopharyngeal  Nasopharyngeal Swab     Status: None   Collection Time: 04/30/20  2:09 PM   Specimen: Nasopharyngeal Swab  Result Value Ref Range Status   SARS Coronavirus 2 NEGATIVE NEGATIVE Final    Comment: (NOTE) SARS-CoV-2 target nucleic acids are NOT DETECTED.  The SARS-CoV-2 RNA is generally detectable in upper and lower respiratory specimens during the acute phase of infection. The lowest concentration of SARS-CoV-2 viral copies this assay can detect is 250 copies / mL. A negative result does not preclude SARS-CoV-2 infection and should not be used as the sole basis for treatment or other patient management decisions.  A negative result may occur with improper specimen collection / handling, submission of specimen other than nasopharyngeal swab, presence of viral mutation(s) within the areas targeted by this assay, and inadequate number of viral copies (<250 copies / mL). A negative result must be combined with clinical observations, patient history, and epidemiological information.  Fact Sheet for Patients:   StrictlyIdeas.no  Fact Sheet for Healthcare Providers: BankingDealers.co.za  This test is not yet approved or  cleared by the [place] FDA and has been authorized for detection and/or diagnosis of SARS-CoV-2 by FDA under an Emergency Use Authorization (EUA).  This EUA will remain in effect (meaning this test can be used) for the duration of the COVID-19 declaration under Section 564(b)(1) of the Act, 21 U.S.C. section 360bbb-3(b)(1), unless the authorization is terminated or revoked sooner.  Performed at [facility], [address], [place], [place] [address]   Surgical PCR screen     Status: None   Collection Time: 04/30/20 11:51 PM   Specimen: Nasal Mucosa; Nasal Swab  Result Value Ref Range Status   MRSA, PCR NEGATIVE NEGATIVE Final   Staphylococcus aureus NEGATIVE NEGATIVE Final    Comment: (NOTE) The Xpert SA Assay (FDA  approved for NASAL specimens in patients 63 years of age and older), is one component of a comprehensive surveillance program. It is not intended to diagnose infection nor to guide or monitor treatment. Performed at [facility], [place] [address], [place], [place] [address]       Radiology Studies: No results found.   LOS: 3 days   Antonieta Pert, MD [facility]  05/03/2020, 8:09 AM

## 2020-05-03 NOTE — Progress Notes (Signed)
Patient ID: CAMDIN HEGNER, male   DOB: 01/07/1946, 74 y.o.   MRN: [id number]  This NP visited patient at the bedside as a follow up to  yesterday's [place].  Met at bedside with patient, his wife and his sister-in-law. Continued education regarding current medical situation; diagnosis, prognosis, goals of care, end-of-life wishes, disposition and options.  Created space and opportunity for patient and his family to explore the thoughts and feelings regarding patient's current medical situation.  Is a difficult time for Mr. Kudrna and his family this is a new devastating diagnosis.  Mr. Imburgia and his family verbalized an understanding that this is not curable and at this point patient faces decisions regarding treatment plan   Plan of Care: -DNR/DNI -No artificial feeding now or in the future - Continue and  complete recommend radiation therapy -Follow-up with Dr. Earlie Server in the [facility] for recommendations on  systemic therapy -Patient and family remain hopeful for continued quality of life.  Discussed with patient/family  the importance of continued conversation with each other and their  medical providers regarding overall plan of care and treatment options,  ensuring decisions are within the context of the patients values and GOCs.  Questions and concerns addressed   Discussed with Dr KC, [facility] on discharge  Total time spent on the unit was 35 minutes  Greater than 50% of the time was spent in counseling and coordination of care  Wadie Lessen NP  Palliative Medicine Team Team Phone # [phone] Pager [phone]

## 2020-05-03 NOTE — Care Management Important Message (Signed)
Important Message  Patient Details IM Letter given to Marney Doctor RN Case Manager to present to the Patient Name: Michael Horton MRN: [id number] Date of Birth: 01-28-1946   Medicare Important Message Given:  Yes     Kerin Salen 05/03/2020, 10:39 AM

## 2020-05-03 NOTE — Consult Note (Addendum)
[place]  [phone] [phone]   MEDICAL ONCOLOGY - INITIAL CONSULTATION  Referral MD: Dr. Antonieta Pert  Reason for Referral: Metastatic squamous cell carcinoma of the lung  HPI: Michael Horton is a 74 year old male with a past medical history significant for infrarenal AAA, anxiety/depression, prior tobacco use, GERD, iron deficiency anemia, recent pyelonephritis, and BPH.  He presented to the emergency room with hemoptysis x1 week.  On admission, his WBC was 13.5 hemoglobin 9.7, MCV 79.0, platelet count 490,000, calcium 10.6, albumin 3.2.  The patient had had a CT of the chest, abdomen, pelvis performed on 04/28/2020 to follow-up for recent pyelonephritis.  This showed a large heterogeneously hyperdense left hilar mass measuring 8.2 cm with obstruction of the left lower lobe bronchus and total atelectasis of the left lower lobe, enlarged subcarinal and bilateral hilar lymph nodes suspicious for nodal metastatic disease, significant increase in infiltrative appearing hypodense lesion in the left kidney which now occupies most of the kidney and measures approximately 8.9 cm and there is an additional lesion of the superior pole of the right kidney which is now more clearly appreciated compared to prior scans measuring 5.3 cm.  The patient had an MRI of the brain with and without contrast performed on 04/30/2020 which showed at least 21 supratentorial enhancing parenchymal lesions.  There was a dominant 3.5 cm lesion centered in the right basal ganglia which has prominent surrounding edema and associated mass-effect with partial effacement of the right lateral and third ventricles and a 9 mm leftward midline shift.  There was also associated leftward subfalcine herniation.  The patient was seen by PCCM this admission and underwent bronchoscopy with EBUS on 05/01/2020. Biopsy of left lung mass ([id number]) showed invasive squamous cell carcinoma.  Cytology of left hilar mass  ([id number]) showed malignant cells consistent with squamous cell carcinoma.  The patient has been seen by radiation oncology with plans for radiation.  He is on dexamethasone.   The patient was seen by Dr. Delton Coombes in consultation for a left kidney mass on 04/02/2020.  A CT of the abdomen and pelvis had been performed prior to his visit with Dr. Delton Coombes, but A CT of the chest had not been performed prior to his visit.  He recommended a multiphasic contrast-enhanced CT in 3 to 4 weeks for further evaluation of the lesion in the left kidney.  When seen today, no family is at the bedside.  The patient reports a poor appetite and weight loss of about 15 pounds over the past few weeks.  He reports headaches but no dizziness.  No recent fevers or chills.  He denies chest pain but does have shortness of breath and cough with hemoptysis.  However, hemoptysis is better today.  Denies abdominal pain, nausea, vomiting, constipation, diarrhea.  No other bleeding other than hemoptysis.  The patient is married and lives in [place], [place].  He has 3 children who live in [place].  Denies history of alcohol use.  Reports a 50+ pack year history of cigarette use.  Family history significant for a paternal grandfather with throat cancer and 2 paternal uncles with cancer - one with lung and the other one of unknown primary.  Medical oncology was asked see the patient make recommendations regarding his metastatic lung cancer.   Past Medical History:  Diagnosis Date  . AAA (abdominal aortic aneurysm) ([facility])   . Anxiety   . Cancer ([facility])    LUNG CANCER  . Depression   . GERD (  gastroesophageal reflux disease)   . Hemoptysis 04/2020  . Hyperlipidemia   :  Past Surgical History:  Procedure Laterality Date  . ANKLE FRACTURE SURGERY Right 2017  . COLONOSCOPY     Per patient, this was in [place], no polyps  . HERNIA REPAIR  1984  . SPINE SURGERY  1987   lumbar  . TIBIA FRACTURE SURGERY Right 2017   :  Current Facility-Administered Medications  Medication Dose Route Frequency Provider Last Rate Last Admin  . 0.9 %  sodium chloride infusion  250 mL Intravenous PRN Manuella Ghazi, Pratik D, DO      . 0.9 %  sodium chloride infusion   Intravenous Continuous Georgette Shell, MD 75 mL/hr at 05/03/20 0529 New Bag at 05/03/20 0529  . acetaminophen (TYLENOL) tablet 650 mg  650 mg Oral Q6H PRN Heath Lark D, DO   650 mg at 05/03/20 1004   Or  . acetaminophen (TYLENOL) suppository 650 mg  650 mg Rectal Q6H PRN Manuella Ghazi, Pratik D, DO      . citalopram (CELEXA) tablet 20 mg  20 mg Oral Daily Manuella Ghazi, Pratik D, DO   20 mg at 05/03/20 0949  . dexamethasone (DECADRON) injection 4 mg  4 mg Intravenous Q6H Georgette Shell, MD   4 mg at 05/03/20 1316  . dicyclomine (BENTYL) capsule 20 mg  20 mg Oral QID PRN Manuella Ghazi, Pratik D, DO      . diphenhydrAMINE (BENADRYL) capsule 25 mg  25 mg Oral QHS PRN Manuella Ghazi, Pratik D, DO   25 mg at 05/02/20 2003  . docusate sodium (COLACE) capsule 100 mg  100 mg Oral Daily PRN Manuella Ghazi, Pratik D, DO      . feeding supplement (ENSURE ENLIVE) (ENSURE ENLIVE) liquid 237 mL  237 mL Oral TID BM Georgette Shell, MD   237 mL at 05/03/20 1316  . ferrous sulfate tablet 325 mg  325 mg Oral Q breakfast Heath Lark D, DO   325 mg at 05/03/20 0744  . multivitamin with minerals tablet 1 tablet  1 tablet Oral Daily Georgette Shell, MD   1 tablet at 05/03/20 [id number]  . mupirocin ointment (BACTROBAN) 2 % 1 application  1 application Nasal BID Heath Lark D, DO   1 application at 54/09/81 0950  . nicotine polacrilex (NICORETTE) gum 4 mg  4 mg Oral PRN Manuella Ghazi, Pratik D, DO      . ondansetron (ZOFRAN) tablet 4 mg  4 mg Oral Q6H PRN Manuella Ghazi, Pratik D, DO       Or  . ondansetron (ZOFRAN) injection 4 mg  4 mg Intravenous Q6H PRN Manuella Ghazi, Pratik D, DO      . pantoprazole (PROTONIX) EC tablet 40 mg  40 mg Oral Daily Little Ishikawa, MD   40 mg at 05/03/20 0949  . sodium chloride flush (NS) 0.9 % injection 3  mL  3 mL Intravenous Q12H Shah, Pratik D, DO   3 mL at 05/01/20 1124  . sodium chloride flush (NS) 0.9 % injection 3 mL  3 mL Intravenous PRN Manuella Ghazi, Pratik D, DO   3 mL at 05/01/20 1123  . tamsulosin (FLOMAX) capsule 0.4 mg  0.4 mg Oral Daily Manuella Ghazi, Pratik D, DO   0.4 mg at 05/03/20 0949  . traMADol (ULTRAM) tablet 50 mg  50 mg Oral Q6H PRN Kc, Ramesh, MD         No Known Allergies:  Family History  Problem Relation Age of Onset  . Dementia Mother   .  Alzheimer's disease Mother   . Hypertension Mother   . Heart disease Father   . Glaucoma Father   . Lung cancer Paternal Uncle   . Throat cancer Paternal Grandfather   . Heart disease Brother   . Arthritis Brother        back issues   . Lymphoma Sister   . Colon cancer Neg Hx   . Esophageal cancer Neg Hx   . Stomach cancer Neg Hx   . Colon polyps Neg Hx   :  Social History   Socioeconomic History  . Marital status: Married    Spouse name: [name]  . Number of children: 3  . Years of education: Not on file  . Highest education level: 11th grade  Occupational History  . Occupation: RETIRED  Tobacco Use  . Smoking status: Former Smoker    Packs/day: 0.50    Years: 56.00    Pack years: 28.00    Types: Cigarettes    Quit date: 03/21/2020    Years since quitting: 0.1  . Smokeless tobacco: Never Used  . Tobacco comment: quit 03/21/2020  Vaping Use  . Vaping Use: Never used  Substance and Sexual Activity  . Alcohol use: No    Comment: rare  . Drug use: No  . Sexual activity: Yes    Birth control/protection: None  Other Topics Concern  . Not on file  Social History Narrative  . Not on file   Social Determinants of Health   Financial Resource Strain: Low Risk   . Difficulty of Paying Living Expenses: Not hard at all  Food Insecurity: No Food Insecurity  . Worried About Charity fundraiser in the Last Year: Never true  . Ran Out of Food in the Last Year: Never true  Transportation Needs: No Transportation Needs  .  Lack of Transportation (Medical): No  . Lack of Transportation (Non-Medical): No  Physical Activity: Inactive  . Days of Exercise per Week: 0 days  . Minutes of Exercise per Session: 0 min  Stress: Stress Concern Present  . Feeling of Stress : To some extent  Social Connections: Moderately Isolated  . Frequency of Communication with Friends and Family: Once a week  . Frequency of Social Gatherings with Friends and Family: Once a week  . Attends Religious Services: 1 to 4 times per year  . Active Member of Clubs or Organizations: No  . Attends Archivist Meetings: Never  . Marital Status: Married  Human resources officer Violence: Not At Risk  . Fear of Current or Ex-Partner: No  . Emotionally Abused: No  . Physically Abused: No  . Sexually Abused: No  :  Review of Systems: A comprehensive 14 point review of systems was negative except as noted in the HPI.  Exam: Patient Vitals for the past 24 hrs:  BP Temp Temp src Pulse Resp SpO2 Height Weight  05/03/20 0616 (!) 165/95 (!) 97.3 F (36.3 C) Oral (!) 57 16 100 % -- --  05/03/20 0230 137/88 97.9 F (36.6 C) Oral 74 14 96 % -- --  05/02/20 2223 (!) 141/85 97.7 F (36.5 C) Oral 72 14 95 % -- --  05/02/20 1837 (!) 154/92 97.7 F (36.5 C) Oral 68 18 96 % -- --  05/02/20 1437 134/84 97.7 F (36.5 C) Oral 66 16 96 % 5\' 10"  (1.778 m) 70.2 kg    General: Awake and alert, no distress Eyes:  PEERL, EOMI, no scleral icterus.   ENT:  There  were no oropharyngeal lesions.   Neck was without thyromegaly.   Lymphatics:  Negative cervical, supraclavicular or axillary adenopathy.   Respiratory: lungs were clear bilaterally without wheezing or crackles.   Cardiovascular:  Regular rate and rhythm, S1/S2, without murmur, rub or gallop.  There was no pedal edema.   GI:  abdomen was soft, flat, nontender, nondistended, without organomegaly.   Musculoskeletal:  Strength symmetrical in the upper and lower extremities.  Skin exam was without  echymosis, petichae.   Neuro exam was nonfocal. Patient was alert and oriented.  Attention was good.   Language was appropriate.  Mood was normal without depression.  Speech was not pressured.  Thought content was not tangential.     Lab Results  Component Value Date   WBC 9.4 05/02/2020   HGB 8.5 (L) 05/02/2020   HCT 28.4 (L) 05/02/2020   PLT 409 (H) 05/02/2020   GLUCOSE 113 (H) 05/02/2020   CHOL 197 02/15/2020   TRIG 159 (H) 02/15/2020   HDL 44 02/15/2020   LDLCALC 125 (H) 02/15/2020   ALT 16 05/02/2020   AST 13 (L) 05/02/2020   NA 132 (L) 05/02/2020   K 4.6 05/02/2020   CL 99 05/02/2020   CREATININE 0.89 05/02/2020   BUN 15 05/02/2020   CO2 24 05/02/2020    CT ABDOMEN PELVIS W WO CONTRAST  Result Date: 04/28/2020 CLINICAL DATA:  Follow-up pyelonephritis EXAM: CT CHEST WITH CONTRAST CT ABDOMEN AND PELVIS WITHOUT AND WITH CONTRAST TECHNIQUE: Multidetector CT imaging of the chest was performed following the standard protocol following the bolus administration of intravenous contrast. Multidetector CT imaging of the abdomen and pelvis was performed following the standard protocol before and following the bolus administration of intravenous contrast. Additional oral enteric contrast was administered. CONTRAST:  130mL OMNIPAQUE IOHEXOL 300 MG/ML SOLN, additional oral enteric contrast COMPARISON:  CT abdomen pelvis, 03/21/2020 FINDINGS: CT CHEST Cardiovascular: Aortic atherosclerosis. Three-vessel coronary artery calcifications. Normal heart size. No pericardial effusion. Mediastinum/Nodes: Enlarged subcarinal and bilateral hilar lymph nodes, measuring up to 2.9 x 1.6 cm (series 12, image 56). Moderate hiatal hernia with intrathoracic position of the gastric fundus. Thyroid gland, trachea, and esophagus demonstrate no significant findings. Lungs/Pleura: Mild centrilobular emphysema. There is a large, heterogeneously hyperdense left hilar mass, measuring approximately 8.2 x 6.0 x 7.7 cm. There  is obstruction of the left lower lobe bronchus and total atelectasis of the left lower lobe. Trace left pleural effusion. Clustered postobstructive nodularity in the left pulmonary apex (series 14, image 27). Musculoskeletal: No chest wall mass or suspicious bone lesions identified. CT ABDOMEN AND PELVIS Hepatobiliary: No solid liver abnormality is seen. No gallstones, gallbladder wall thickening, or biliary dilatation. Pancreas: Unremarkable. No pancreatic ductal dilatation or surrounding inflammatory changes. Spleen: Normal in size without significant abnormality. Adrenals/Urinary Tract: Adrenal glands are unremarkable. There has been a significant increase in infiltrative appearing, hypodense lesion of the left kidney, which now occupies most of the kidney and measures approximately 8.9 x 5.7 x 5.3 cm (series 10, image 66). There is an additional lesion of the superior pole of the right kidney, which is now much more clearly appreciated than subtle appearance on prior examination, measuring 5.3 x 4.9 x 4.8 cm (series 10, image 70). Bladder is unremarkable. Stomach/Bowel: Stomach is within normal limits. Appendix appears normal. No evidence of bowel wall thickening, distention, or inflammatory changes. Sigmoid diverticula. Vascular/Lymphatic: Aortic atherosclerosis. Redemonstrated large saccular aneurysm of the infrarenal abdominal aorta, measuring 6.8 x 6.6 cm, previously 6.7 x 6.5 cm (  series 12, image 140). Additional aneurysm of the bilateral common iliac arteries. No enlarged abdominal or pelvic lymph nodes. Reproductive: No mass or other significant abnormality. Other: No abdominal wall hernia or abnormality. No abdominopelvic ascites. Musculoskeletal: No acute or significant osseous findings. IMPRESSION: 1. There is a large, heterogeneously hyperdense left hilar mass, measuring approximately 8.2 cm. There is obstruction of the left lower lobe bronchus and total atelectasis of the left lower lobe. 2. Enlarged  subcarinal and bilateral hilar lymph nodes, suspicious for nodal metastatic disease. 3. There has been a significant increase in infiltrative appearing, hypodense lesion of the left kidney, which now occupies most of the kidney and measures approximately 8.9 cm. There is an additional lesion of the superior pole of the right kidney, which is now much more clearly appreciated than subtle appearance on prior examination, measuring 5.3 cm. 4. 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 No other evidence of distant metastatic disease is appreciated. 5. 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 Recommend surgical consultation. Aortic aneurysm NOS (ICD10-I71.9). Aortic Atherosclerosis (ICD10-I70.0). 6. Emphysema (ICD10-J43.9). 7. Coronary artery disease. These results will be called to the ordering clinician or representative by the Radiologist Assistant, and communication documented in the PACS or [employer]. Electronically Signed   By: Eddie Candle M.D.   On: 04/28/2020 12:07   DG Chest 2 View  Result Date: 04/30/2020 CLINICAL DATA:  Hemoptysis for 1 week EXAM: CHEST - 2 VIEW COMPARISON:  Chest x-ray from April 13, 2020, CT chest from April 27, 2020 FINDINGS: Added density along the LEFT lower lobe, superior segment partially visualized on abdominal CT showing no change and undulating borders on the lateral view. Cardiomediastinal contours are otherwise stable. Increased density in the retrocardiac region also likely due in part to hiatal hernia. Signs of compression fracture at T12 similar to prior imaging. RIGHT lung remains clear. IMPRESSION: 1. Stable chest.  Without acute changes since April 13, 2020. 2. Known mass at the LEFT hilum with associated volume loss is similar to the prior study 3. Stable hiatal hernia. 4.  Signs of compression fracture in the lower thoracic spine as before. Electronically Signed   By: Zetta Bills M.D.   On: 04/30/2020 13:08   DG Chest 2 View  Result Date: 04/13/2020 CLINICAL DATA:  Community acquired pneumonia EXAM: CHEST - 2 VIEW COMPARISON:  01/02/2017, CT from 03/21/2020 FINDINGS: Cardiac shadow is stable. Tortuous thoracic aorta with calcifications is seen. Hiatal hernia is again noted. Increased soft tissue density is noted over the thoracic spine posteriorly best noted on the lateral projection but overlapping the hila on the frontal. No focal infiltrate is seen. These changes are highly suspicious for underlying mass particularly given the patient's history of smoking. Previously seen lower lobe consolidation on CT has improved. IMPRESSION: Masslike density in the left lower lobe superior segment best visualized on the lateral projection. CT of the chest with contrast is recommended for further evaluation. These results will be called to the ordering clinician or representative by the Radiologist Assistant, and communication documented in the PACS or [employer]. Electronically Signed   By: Inez Catalina M.D.   On: 04/13/2020 15:09   CT CHEST W CONTRAST  Result Date: 04/28/2020 CLINICAL DATA:  Follow-up pyelonephritis EXAM: CT CHEST WITH CONTRAST CT ABDOMEN AND PELVIS WITHOUT AND WITH CONTRAST TECHNIQUE: Multidetector CT  imaging of the chest was performed following the standard protocol following the bolus administration of intravenous contrast. Multidetector CT imaging of the abdomen and pelvis was performed following the standard protocol before and following the bolus administration of intravenous contrast. Additional oral enteric contrast was administered. CONTRAST:  116mL OMNIPAQUE IOHEXOL 300 MG/ML SOLN, additional oral enteric contrast COMPARISON:  CT abdomen pelvis, 03/21/2020 FINDINGS: CT CHEST Cardiovascular: Aortic atherosclerosis. Three-vessel coronary artery  calcifications. Normal heart size. No pericardial effusion. Mediastinum/Nodes: Enlarged subcarinal and bilateral hilar lymph nodes, measuring up to 2.9 x 1.6 cm (series 12, image 56). Moderate hiatal hernia with intrathoracic position of the gastric fundus. Thyroid gland, trachea, and esophagus demonstrate no significant findings. Lungs/Pleura: Mild centrilobular emphysema. There is a large, heterogeneously hyperdense left hilar mass, measuring approximately 8.2 x 6.0 x 7.7 cm. There is obstruction of the left lower lobe bronchus and total atelectasis of the left lower lobe. Trace left pleural effusion. Clustered postobstructive nodularity in the left pulmonary apex (series 14, image 27). Musculoskeletal: No chest wall mass or suspicious bone lesions identified. CT ABDOMEN AND PELVIS Hepatobiliary: No solid liver abnormality is seen. No gallstones, gallbladder wall thickening, or biliary dilatation. Pancreas: Unremarkable. No pancreatic ductal dilatation or surrounding inflammatory changes. Spleen: Normal in size without significant abnormality. Adrenals/Urinary Tract: Adrenal glands are unremarkable. There has been a significant increase in infiltrative appearing, hypodense lesion of the left kidney, which now occupies most of the kidney and measures approximately 8.9 x 5.7 x 5.3 cm (series 10, image 66). There is an additional lesion of the superior pole of the right kidney, which is now much more clearly appreciated than subtle appearance on prior examination, measuring 5.3 x 4.9 x 4.8 cm (series 10, image 70). Bladder is unremarkable. Stomach/Bowel: Stomach is within normal limits. Appendix appears normal. No evidence of bowel wall thickening, distention, or inflammatory changes. Sigmoid diverticula. Vascular/Lymphatic: Aortic atherosclerosis. 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 Additional aneurysm of  the bilateral common iliac arteries. No enlarged abdominal or pelvic lymph nodes. Reproductive: No mass or other significant abnormality. Other: No abdominal wall hernia or abnormality. No abdominopelvic ascites. Musculoskeletal: No acute or significant osseous findings. IMPRESSION: 1. There is a large, heterogeneously hyperdense left hilar mass, measuring approximately 8.2 cm. There is obstruction of the left lower lobe bronchus and total atelectasis of the left lower lobe. 2. Enlarged subcarinal and bilateral hilar lymph nodes, suspicious for nodal metastatic disease. 3. There has been a significant increase in infiltrative appearing, hypodense lesion of the left kidney, which now occupies most of the kidney and measures approximately 8.9 cm. There is an additional lesion of the superior pole of the right kidney, which is now much more clearly appreciated than subtle appearance on prior examination, measuring 5.3 cm. 4. 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 No other evidence of distant metastatic disease is appreciated. 5. 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 Recommend surgical consultation. Aortic aneurysm NOS (ICD10-I71.9). Aortic Atherosclerosis (ICD10-I70.0). 6. Emphysema (ICD10-J43.9). 7. Coronary artery disease. These results will be called to the ordering clinician or representative by the Radiologist Assistant, and communication documented in the PACS or [employer]. Electronically Signed   By: Eddie Candle M.D.   On: 04/28/2020 12:07   MR BRAIN  W WO CONTRAST  Addendum Date: 04/30/2020   ADDENDUM REPORT: 04/30/2020 20:25 ADDENDUM: These results were called by telephone at the time of interpretation on 04/30/2020 at 8:25 pm to provider NP Lang Snow , who verbally acknowledged these results.  Electronically Signed   By: Kellie Simmering DO   On: 04/30/2020 20:25   Result Date: 04/30/2020 CLINICAL DATA:  Headache, acute, normal neuro exam. Additional history provided: Left-sided hilar mass, subcarinal and hilar lymph node enlargement, bilateral renal lesions. EXAM: MRI HEAD WITHOUT AND WITH CONTRAST TECHNIQUE: Multiplanar, multiecho pulse sequences of the brain and surrounding structures were obtained without and with intravenous contrast. CONTRAST:  20mL GADAVIST GADOBUTROL 1 MMOL/ML IV SOLN COMPARISON:  No pertinent prior studies available for comparison. 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 These lesions predominately demonstrate peripheral enhancement and corresponding restricted diffusion. These lesions are as follows. Peripherally enhancing, centrally necrotic mass centered within the right basal ganglia measuring 3.5 x 3.4 x 3.3 cm (series 10, image 31). There is prominent surrounding edema within the basal ganglia, also extending into the adjacent white matter tracks, right thalamus, anterior corpus callosum, right temporal lobe and into the midbrain. There is associated mass effect with partial effacement of the lateral and third ventricles. 9 mm leftward midline shift measured at the level of the septum pellucidum. There is leftward subfalcine herniation. At least 20 additional subcentimeter peripherally enhancing metastases are identified within the supratentorial and infratentorial brain (please see annotations on the axial postcontrast T1 weighted sequence. Some these additional metastatic lesions have mild associated edema. Of note, one of these metastases is present within the posterior midbrain near the cerebral aqueduct (series 10, images 23-24). There is no acute infarct. No evidence of intracranial mass. No chronic intracranial blood products. No extra-axial fluid collection. Vascular: Expected proximal arterial  flow voids. Non dominant intracranial right vertebral artery. Skull and upper cervical spine: No focal marrow lesion. Sinuses/Orbits: Visualized orbits show no acute finding. Mild paranasal sinus mucosal thickening, most notably ethmoid. No significant mastoid effusion. IMPRESSION: At least 21 supratentorial and infratentorial enhancing parenchymal lesions are identified. 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 A dominant 3.5 cm lesion centered within the right basal ganglia has prominent surrounding edema. There is associated mass effect with partial effacement of the right lateral and third ventricles, and 9 mm leftward midline shift. Associated leftward subfalcine herniation. The remaining metastases are subcentimeter with some of these lesions having mild associated edema. Of note, one of these lesions is located within the posterior midbrain adjacent to the cerebral aqueduct. No evidence of hydrocephalus at this time. Mild paranasal sinus mucosal thickening. Electronically Signed: By: Kellie Simmering DO On: 04/30/2020 20:03     CT ABDOMEN PELVIS W WO CONTRAST  Result Date: 04/28/2020 CLINICAL DATA:  Follow-up pyelonephritis EXAM: CT CHEST WITH CONTRAST CT ABDOMEN AND PELVIS WITHOUT AND WITH CONTRAST TECHNIQUE: Multidetector CT imaging of the chest was performed following the standard protocol following the bolus administration of intravenous contrast. Multidetector CT imaging of the abdomen and pelvis was performed following the standard protocol before and following the bolus administration of intravenous contrast. Additional oral enteric contrast was administered. CONTRAST:  170mL OMNIPAQUE IOHEXOL 300 MG/ML SOLN, additional oral enteric contrast COMPARISON:  CT abdomen pelvis, 03/21/2020 FINDINGS: CT CHEST Cardiovascular: Aortic atherosclerosis. Three-vessel coronary artery calcifications. Normal heart size. No pericardial effusion. Mediastinum/Nodes: Enlarged  subcarinal and bilateral hilar lymph nodes, measuring up to 2.9 x 1.6 cm (series  12, image 56). Moderate hiatal hernia with intrathoracic position of the gastric fundus. Thyroid gland, trachea, and esophagus demonstrate no significant findings. Lungs/Pleura: Mild centrilobular emphysema. There is a large, heterogeneously hyperdense left hilar mass, measuring approximately 8.2 x 6.0 x 7.7 cm. There is obstruction of the left lower lobe bronchus and total atelectasis of the left lower lobe. Trace left pleural effusion. Clustered postobstructive nodularity in the left pulmonary apex (series 14, image 27). Musculoskeletal: No chest wall mass or suspicious bone lesions identified. CT ABDOMEN AND PELVIS Hepatobiliary: No solid liver abnormality is seen. No gallstones, gallbladder wall thickening, or biliary dilatation. Pancreas: Unremarkable. No pancreatic ductal dilatation or surrounding inflammatory changes. Spleen: Normal in size without significant abnormality. Adrenals/Urinary Tract: Adrenal glands are unremarkable. There has been a significant increase in infiltrative appearing, hypodense lesion of the left kidney, which now occupies most of the kidney and measures approximately 8.9 x 5.7 x 5.3 cm (series 10, image 66). There is an additional lesion of the superior pole of the right kidney, which is now much more clearly appreciated than subtle appearance on prior examination, measuring 5.3 x 4.9 x 4.8 cm (series 10, image 70). Bladder is unremarkable. Stomach/Bowel: Stomach is within normal limits. Appendix appears normal. No evidence of bowel wall thickening, distention, or inflammatory changes. Sigmoid diverticula. Vascular/Lymphatic: Aortic atherosclerosis. 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 Additional aneurysm of the bilateral common iliac arteries. No enlarged abdominal or pelvic lymph nodes.  Reproductive: No mass or other significant abnormality. Other: No abdominal wall hernia or abnormality. No abdominopelvic ascites. Musculoskeletal: No acute or significant osseous findings. IMPRESSION: 1. There is a large, heterogeneously hyperdense left hilar mass, measuring approximately 8.2 cm. There is obstruction of the left lower lobe bronchus and total atelectasis of the left lower lobe. 2. Enlarged subcarinal and bilateral hilar lymph nodes, suspicious for nodal metastatic disease. 3. There has been a significant increase in infiltrative appearing, hypodense lesion of the left kidney, which now occupies most of the kidney and measures approximately 8.9 cm. There is an additional lesion of the superior pole of the right kidney, which is now much more clearly appreciated than subtle appearance on prior examination, measuring 5.3 cm. 4. 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 No other evidence of distant metastatic disease is appreciated. 5. 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 Recommend surgical consultation. Aortic aneurysm NOS (ICD10-I71.9). Aortic Atherosclerosis (ICD10-I70.0). 6. Emphysema (ICD10-J43.9). 7. Coronary artery disease. These results will be called to the ordering clinician or representative by the Radiologist Assistant, and communication documented in the PACS or [employer]. Electronically Signed   By: Eddie Candle M.D.   On: 04/28/2020 12:07   DG Chest 2 View  Result Date: 04/30/2020 CLINICAL DATA:  Hemoptysis for 1 week EXAM: CHEST - 2 VIEW COMPARISON:  Chest x-ray from April 13, 2020, CT chest from April 27, 2020 FINDINGS: Added density along the LEFT lower lobe, superior segment partially visualized on abdominal CT showing no change and undulating borders on the lateral view.  Cardiomediastinal contours are otherwise stable. Increased density in the retrocardiac region also likely due in part to hiatal hernia. Signs of compression fracture at T12 similar to prior imaging. RIGHT lung remains clear. IMPRESSION: 1. Stable chest.  Without  acute changes since April 13, 2020. 2. Known mass at the LEFT hilum with associated volume loss is similar to the prior study 3. Stable hiatal hernia. 4. Signs of compression fracture in the lower thoracic spine as before. Electronically Signed   By: Zetta Bills M.D.   On: 04/30/2020 13:08   DG Chest 2 View  Result Date: 04/13/2020 CLINICAL DATA:  Community acquired pneumonia EXAM: CHEST - 2 VIEW COMPARISON:  01/02/2017, CT from 03/21/2020 FINDINGS: Cardiac shadow is stable. Tortuous thoracic aorta with calcifications is seen. Hiatal hernia is again noted. Increased soft tissue density is noted over the thoracic spine posteriorly best noted on the lateral projection but overlapping the hila on the frontal. No focal infiltrate is seen. These changes are highly suspicious for underlying mass particularly given the patient's history of smoking. Previously seen lower lobe consolidation on CT has improved. IMPRESSION: Masslike density in the left lower lobe superior segment best visualized on the lateral projection. CT of the chest with contrast is recommended for further evaluation. These results will be called to the ordering clinician or representative by the Radiologist Assistant, and communication documented in the PACS or [employer]. Electronically Signed   By: Inez Catalina M.D.   On: 04/13/2020 15:09   CT CHEST W CONTRAST  Result Date: 04/28/2020 CLINICAL DATA:  Follow-up pyelonephritis EXAM: CT CHEST WITH CONTRAST CT ABDOMEN AND PELVIS WITHOUT AND WITH CONTRAST TECHNIQUE: Multidetector CT imaging of the chest was performed following the standard protocol following the bolus administration of intravenous contrast. Multidetector CT imaging of  the abdomen and pelvis was performed following the standard protocol before and following the bolus administration of intravenous contrast. Additional oral enteric contrast was administered. CONTRAST:  182mL OMNIPAQUE IOHEXOL 300 MG/ML SOLN, additional oral enteric contrast COMPARISON:  CT abdomen pelvis, 03/21/2020 FINDINGS: CT CHEST Cardiovascular: Aortic atherosclerosis. Three-vessel coronary artery calcifications. Normal heart size. No pericardial effusion. Mediastinum/Nodes: Enlarged subcarinal and bilateral hilar lymph nodes, measuring up to 2.9 x 1.6 cm (series 12, image 56). Moderate hiatal hernia with intrathoracic position of the gastric fundus. Thyroid gland, trachea, and esophagus demonstrate no significant findings. Lungs/Pleura: Mild centrilobular emphysema. There is a large, heterogeneously hyperdense left hilar mass, measuring approximately 8.2 x 6.0 x 7.7 cm. There is obstruction of the left lower lobe bronchus and total atelectasis of the left lower lobe. Trace left pleural effusion. Clustered postobstructive nodularity in the left pulmonary apex (series 14, image 27). Musculoskeletal: No chest wall mass or suspicious bone lesions identified. CT ABDOMEN AND PELVIS Hepatobiliary: No solid liver abnormality is seen. No gallstones, gallbladder wall thickening, or biliary dilatation. Pancreas: Unremarkable. No pancreatic ductal dilatation or surrounding inflammatory changes. Spleen: Normal in size without significant abnormality. Adrenals/Urinary Tract: Adrenal glands are unremarkable. There has been a significant increase in infiltrative appearing, hypodense lesion of the left kidney, which now occupies most of the kidney and measures approximately 8.9 x 5.7 x 5.3 cm (series 10, image 66). There is an additional lesion of the superior pole of the right kidney, which is now much more clearly appreciated than subtle appearance on prior examination, measuring 5.3 x 4.9 x 4.8 cm (series 10, image 70).  Bladder is unremarkable. Stomach/Bowel: Stomach is within normal limits. Appendix appears normal. No evidence of bowel wall thickening, distention, or inflammatory changes. Sigmoid diverticula. Vascular/Lymphatic: Aortic atherosclerosis. 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 Additional aneurysm of the bilateral common  iliac arteries. No enlarged abdominal or pelvic lymph nodes. Reproductive: No mass or other significant abnormality. Other: No abdominal wall hernia or abnormality. No abdominopelvic ascites. Musculoskeletal: No acute or significant osseous findings. IMPRESSION: 1. There is a large, heterogeneously hyperdense left hilar mass, measuring approximately 8.2 cm. There is obstruction of the left lower lobe bronchus and total atelectasis of the left lower lobe. 2. Enlarged subcarinal and bilateral hilar lymph nodes, suspicious for nodal metastatic disease. 3. There has been a significant increase in infiltrative appearing, hypodense lesion of the left kidney, which now occupies most of the kidney and measures approximately 8.9 cm. There is an additional lesion of the superior pole of the right kidney, which is now much more clearly appreciated than subtle appearance on prior examination, measuring 5.3 cm. 4. 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 No other evidence of distant metastatic disease is appreciated. 5. 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 Recommend surgical consultation. Aortic aneurysm NOS (ICD10-I71.9). Aortic Atherosclerosis (ICD10-I70.0). 6. Emphysema (ICD10-J43.9). 7. Coronary artery disease. These results will be called to the ordering clinician or representative by the Radiologist  Assistant, and communication documented in the PACS or [employer]. Electronically Signed   By: Eddie Candle M.D.   On: 04/28/2020 12:07   MR BRAIN W WO CONTRAST  Addendum Date: 04/30/2020   ADDENDUM REPORT: 04/30/2020 20:25 ADDENDUM: These results were called by telephone at the time of interpretation on 04/30/2020 at 8:25 pm to provider NP Lang Snow , who verbally acknowledged these results. Electronically Signed   By: Kellie Simmering DO   On: 04/30/2020 20:25   Result Date: 04/30/2020 CLINICAL DATA:  Headache, acute, normal neuro exam. Additional history provided: Left-sided hilar mass, subcarinal and hilar lymph node enlargement, bilateral renal lesions. EXAM: MRI HEAD WITHOUT AND WITH CONTRAST TECHNIQUE: Multiplanar, multiecho pulse sequences of the brain and surrounding structures were obtained without and with intravenous contrast. CONTRAST:  42mL GADAVIST GADOBUTROL 1 MMOL/ML IV SOLN COMPARISON:  No pertinent prior studies available for comparison. 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 These lesions predominately demonstrate peripheral enhancement and corresponding restricted diffusion. These lesions are as follows. Peripherally enhancing, centrally necrotic mass centered within the right basal ganglia measuring 3.5 x 3.4 x 3.3 cm (series 10, image 31). There is prominent surrounding edema within the basal ganglia, also extending into the adjacent white matter tracks, right thalamus, anterior corpus callosum, right temporal lobe and into the midbrain. There is associated mass effect with partial effacement of the lateral and third ventricles. 9 mm leftward midline shift measured at the level of the septum pellucidum. There is leftward subfalcine herniation. At least 20 additional subcentimeter peripherally enhancing metastases are identified within the supratentorial and infratentorial brain (please see annotations on the  axial postcontrast T1 weighted sequence. Some these additional metastatic lesions have mild associated edema. Of note, one of these metastases is present within the posterior midbrain near the cerebral aqueduct (series 10, images 23-24). There is no acute infarct. No evidence of intracranial mass. No chronic intracranial blood products. No extra-axial fluid collection. Vascular: Expected proximal arterial flow voids. Non dominant intracranial right vertebral artery. Skull and upper cervical spine: No focal marrow lesion. Sinuses/Orbits: Visualized orbits show no acute finding. Mild paranasal sinus mucosal thickening, most notably ethmoid. No significant mastoid effusion. IMPRESSION:  At least 21 supratentorial and infratentorial enhancing parenchymal lesions are identified. 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 A dominant 3.5 cm lesion centered within the right basal ganglia has prominent surrounding edema. There is associated mass effect with partial effacement of the right lateral and third ventricles, and 9 mm leftward midline shift. Associated leftward subfalcine herniation. The remaining metastases are subcentimeter with some of these lesions having mild associated edema. Of note, one of these lesions is located within the posterior midbrain adjacent to the cerebral aqueduct. No evidence of hydrocephalus at this time. Mild paranasal sinus mucosal thickening. Electronically Signed: By: Kellie Simmering DO On: 04/30/2020 20:03    Pathology:  SURGICAL PATHOLOGY  CASE: [id number]  PATIENT: [name]  Surgical Pathology Report   Clinical History: left lung cancer (cm)   FINAL MICROSCOPIC DIAGNOSIS:   A. LUNG, LEFT MASS, ENDOBRONCHIAL BIOPSY:  - Invasive squamous cell carcinoma, see comment   COMMENT:   Dr. Tresa Moore reviewed the case and concurs with the diagnosis. Dr. Valeta Harms  was notified on 05/02/2020.   GROSS DESCRIPTION:   Received in saline and sent  1.2 x 0.6 x 0.1 cm aggregate of tan-pink  soft tissue, submitted entirely in 1 cassette. (AK 05/01/2020)   Final Diagnosis performed by Jaquita Folds, MD.  Electronically  signed 05/02/2020   CYTOLOGY - NON PAP  CASE: [id number]  PATIENT: Chrystie Nose Wingard  Non-Gynecological Cytology Report   Clinical History: Tobacco use, kidney mass, brain lesions  Specimen Submitted: A. LUNG, LEFT HILAR MASS, FINE NEEDLE ASPIRATION:   FINAL MICROSCOPIC DIAGNOSIS:  - Malignant cells consistent with squamous cell carcinoma  - See comment   SPECIMEN ADEQUACY:  Satisfactory for evaluation   DIAGNOSTIC COMMENTS:  Please correlate with results of concurrent biopsy from this patient  [id number]   GROSS:  Received is/are:(A)(1) 2 Slides (1 Quick stain). Also received are  10cc's of red saline solution from needle rinses  Smears: 0  Concentration Method (Thin Prep):(A) 1  Cell Block: (A) 1 Conventional  Additional Studies: N/A   Final Diagnosis performed by Jaquita Folds, MD.  Electronically  signed 05/02/2020   Assessment and Plan:  This is a pleasant 74 year old Caucasian male with metastatic non-small cell lung cancer, squamous cell carcinoma.  He was diagnosed in July 2021.  The patient presented with a left hilar mass and obstruction of the left lower lobe of the bronchus, enlarged subcarinal and bilateral hilar lymph nodes, and brain metastases.  Discussed the diagnosis, prognosis, and treatment options with the patient.  Discussed with patient that metastatic lung cancer is not curable.  Discussed treatment options with the patient including palliative care/hospice versus treatment with systemic therapy. Further discussion per Dr. Julien Nordmann.   Recommend for him to proceed with radiation to the brain and chest under the care of Dr. Tammi Klippel.  Dexamethasone to be tapered per radiation oncology.  Discussed with the patient he is already established with Dr. Delton Coombes in [place], [place].  The patient can follow with Dr. Delton Coombes as an outpatient or establish care in [place] if he wishes to do so.  For the patient's iron deficiency anemia, recommend for him to continue ferrous sulfate.  Transfuse PRBCs for hemoglobin less than 8.  Thank you for this referral.   Mikey Bussing, DNP, AGPCNP-BC, AOCNP  ADDENDUM: Hematology/Oncology Attending: I had a face-to-face encounter with the patient.  I recommended his care plan and I agree with the above note.  This is a very pleasant  74 years old white male with multiple medical problems and long history of smoking who is recently diagnosed with stage IV (T4, N3, M1c) non-small cell lung cancer, squamous cell carcinoma presented with large left hilar mass in addition to mediastinal and bilateral hilar lymphadenopathy as well as metastatic disease to the right adrenal gland, left kidney as well as multiple brain metastasis. The patient is feeling better today after starting treatment with Decadron for the vasogenic edema surrounding the brain metastasis.  He also has improvement of his hemoptysis. I had a lengthy discussion with the patient today about his current condition and treatment options. The patient will need full staging work-up by ordering a PET scan on outpatient basis. I strongly recommend for the patient to proceed with the palliative radiotherapy to the brain as well as the obstructive left hilar mass. I discussed with the patient his treatment options including palliative care and hospice referral versus palliative systemic chemotherapy with carboplatin, paclitaxel and Keytruda every 3 weeks after completion of the brain radiation. The patient is interested in proceeding with systemic chemotherapy and he would like his treatment to be done in [place].  He was previously seen by Dr. Delton Coombes for evaluation of the left kidney mass. We will let Dr. Delton Coombes knows about the patient's wishes. I will arrange for  the patient to have a follow-up appointment with me at the [facility] close to the end of his palliative radiation for more detailed discussion of his treatment options as well as the adverse effect of the treatment and prognosis. Thank you so much for allowing me to participate in the care of Mr. Deveney.  Please call if you have any questions.  Disclaimer: This note was dictated with voice recognition software. Similar sounding words can inadvertently be transcribed and may be missed upon review. Eilleen Kempf, MD

## 2020-05-04 ENCOUNTER — Telehealth: Payer: Self-pay | Admitting: Radiation Oncology

## 2020-05-04 ENCOUNTER — Ambulatory Visit: Payer: Medicare HMO

## 2020-05-04 ENCOUNTER — Other Ambulatory Visit (HOSPITAL_COMMUNITY): Payer: Medicare HMO

## 2020-05-04 ENCOUNTER — Ambulatory Visit
Admit: 2020-05-04 | Discharge: 2020-05-04 | Disposition: A | Discharge status: Home / Self Care | Payer: Medicare HMO | Attending: Radiation Oncology | Admitting: Radiation Oncology

## 2020-05-04 ENCOUNTER — Telehealth: Payer: Self-pay | Admitting: Family

## 2020-05-04 DIAGNOSIS — C7931 Secondary malignant neoplasm of brain: Secondary | ICD-10-CM | POA: Insufficient documentation

## 2020-05-04 MED ORDER — DEXAMETHASONE 4 MG PO TABS
4.0000 mg | ORAL_TABLET | Freq: Four times a day (QID) | ORAL | 0 refills | Status: DC
Start: 2020-05-04 — End: 2020-05-09

## 2020-05-04 MED ORDER — TRAMADOL HCL 50 MG PO TABS: 50.0000 mg | ORAL_TABLET | Freq: Four times a day (QID) | ORAL | 0 refills | Status: AC | PRN

## 2020-05-04 MED ORDER — TAMSULOSIN HCL 0.4 MG PO CAPS: 0.4000 mg | ORAL_CAPSULE | Freq: Every day | ORAL | 0 refills | Status: AC

## 2020-05-04 NOTE — Telephone Encounter (Signed)
FYI for provider.  Wife requesting phone conversation.

## 2020-05-04 NOTE — Progress Notes (Signed)
   05/04/20 1300  Clinical Encounter Type  Visited With Patient and family together  Visit Type Initial;Psychological support;Spiritual support  Referral From Nurse  Consult/Referral To Chaplain  Spiritual Encounters  Spiritual Needs Emotional;Other (Comment) (Advance Directive )  Stress Factors  Patient Stress Factors Other (Comment)  Family Stress Factors Other (Comment)  Advance Directives (For Healthcare)  Does Patient Have a Medical Advance Directive? Yes  Type of Paramedic of [place];Living will  Copy of [place] in Chart? Yes - validated most recent copy scanned in chart (See row information)  Copy of Living Will in Chart? Yes - validated most recent copy scanned in chart (See row information)    I was consulted to notarize Michael Horton completed copy of his Advance Directive. He completed the Healthcare Power of Attorney and Living Will. Michael Horton nurse procured two witnesses and I notarized the document.  A copy was placed in his paper chart and I gave the original copy back to Michael Horton.   Please, contact [facility] for further assistance.   Chaplain Shanon Ace M.Div., [facility]

## 2020-05-04 NOTE — Telephone Encounter (Signed)
Received note from [facility], transporter, that patient's wife, Burman Nieves, request this RN call her. Phoned [name] as requested. Burman Nieves explains her husband's memory isn't sharp like it once was and questions what was discuss during the PUT encounter with Dr. Tammi Klippel today. Explains those appointments are brief and Dr. Tammi Klippel simply checks with the patient to ensure he is tolerating the treatment well. Explained that her husband was without complaints, seems to be tolerating the radiation treatment well and the plan is to administer his next treatment on Monday. Burman Nieves confirms she is aware of the date and time of the next three appointments and verbalized appreciation for the call.

## 2020-05-04 NOTE — Progress Notes (Signed)
  Radiation Oncology         [phone] ________________________________  Name: Michael Horton MRN: [id number]  Date: 05/02/2020  DOB: [birth year]  SIMULATION AND TREATMENT PLANNING NOTE    ICD-10-CM   1. Brain metastases ([place])  C79.31   2. Malignant neoplasm of hilus of left lung ([facility])  C34.02     DIAGNOSIS: 74 yo man with hemoptysis from left hilar metastatic NSCLC and diffuse brain metastases.  NARRATIVE:  The patient was brought to the [facility].  Identity was confirmed.  All relevant records and images related to the planned course of therapy were reviewed.  The patient freely provided informed written consent to proceed with treatment after reviewing the details related to the planned course of therapy. The consent form was witnessed and verified by the simulation staff.  Then, the patient was set-up in a stable reproducible  supine position for radiation therapy.  CT images were obtained.  Surface markings were placed.  The CT images were loaded into the planning software.  Then the target and avoidance structures were contoured.  Treatment planning then occurred.  The radiation prescription was entered and confirmed.  Then, I designed and supervised the construction of a total of 5 medically necessary complex treatment devices, including a custom made thermoplastic mask used for immobilization and two complex multileaf collimators to cover the entire intracranial contents, while shielding the eyes and face.  Each [facility] is independently created to account for beam divergence.  The right and left lateral fields will be treated with 6 MV X-rays.  I have requested : Isodose Plan.   In addition 2 MLCs were created to cover the left lung cancer shielding the right lung, spinal cord.  3D plan was requested with [facility] of both lungs, heart and spinal cord.  PLAN:  The whole brain and chest will be treated to 20 Gy in 5 fractions.  ________________________________  Sheral Apley Tammi Klippel,  M.D.

## 2020-05-04 NOTE — Discharge Summary (Signed)
Physician Discharge Summary  Michael Horton:[id number] DOB: Mar 03, 1946 DOA: 04/30/2020  PCP: Sharion Balloon, FNP  Admit date: 04/30/2020 Discharge date: 05/04/2020  Admitted From: Home Disposition:  Home  Recommendations for Outpatient Follow-up:  1. Follow up with PCP in 1-2 weeks 2. Follow-up with medical oncology as outpatient. 3. Please obtain BMP/CBC in one week 4. Please follow up on the following pending results:  Home Health:No  Equipment/Devices: None  Discharge Condition: Stable Code Status: DNR Diet recommendation:  Diet Order            Diet general           Diet regular Room service appropriate? Yes; Fluid consistency: Thin  Diet effective now                Brief/Interim Summary: 74 y.o.malewith medical history significant forinfrarenal AAA, anxiety/depression, prior tobacco use, GERD, iron deficiency anemia, recent pyelonephritis,and BPH who presented to the ED after having coughed up dark blood with some clots over the last 1 week. He denies any chest pain, sore throat, epistaxis, or any other issues. He does not appear to be on blood thinners. He is scheduled for an abdominal aortic endovascular stent to be placed by Dr. Magdalene Molly on 7/15 as well. He has had recent pyelonephritis that was treated by his PCP and has had a recent CT chest/abdomen/pelvis performed demonstrating a significant left sided hilar mass as well as left and right kidney mass. He denies any fevers or chills or sick contacts. He has apparently lost approximately 15 pounds unintentionally in the last several weeks.In [id number] vital signs noted and leukocytosis of 13,500 noted as well as sodium 133. Hemoglobin is 9.7 compared to approximately 12,1 month ago. 2 view chest x-ray with finding of large left hilar mass and noted chronic thoracic compression fracture. Status post bronchoscopy, pulmonary eval.  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 7/7- tx to  [facility].  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 Patient is on Decadron.Transferred to [facility] for expedited radiation and started on XRT.Seen by Dr. Julien Nordmann and he will f/u as OP.He will cont on tramadol for pain control.  Multiple brain lesions seen in the MRI. Started on XRT.Cont decadron.No headache no other symptoms.  Leukocytosis likely leukemoid reaction in the setting of #1 on a steroid as well. Afebrile no evidence of infection.  Infrarenal aortic aneurysm endovascular followed by Dr. Gilman Buttner is going to follow-up on as-needed basis and he is keeping the aneurysm on the back burner at this time given the #1.  Mild hyponatremia:stable. Anemia with hemoglobin in mid 8 gm suspect from chronic disease, malignancy.Continue iron supplementation.  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 Anxiety/depression: Cont Celexa.  Consults:  [place],Rad onc, PCCM, Palliative  Subjective: No new complaints. Doing well post xrt- coughing at times, but no hemoptysis  Discharge Exam: Vitals:   05/03/20 2032 05/04/20 0548  BP: 134/88 (!) 156/89  Pulse: 73 (!) 56  Resp: 16 16  Temp: 97.8 F (36.6 C) 97.6 F (36.4 C)  SpO2: 96% 95%   General: Pt is alert, awake, not in acute distress Cardiovascular: RRR, S1/S2 +, no rubs, no gallops Respiratory: CTA bilaterally, no wheezing, no rhonchi Abdominal: Soft,  NT, ND, bowel sounds + Extremities: no edema, no cyanosis  Discharge Instructions  Discharge Instructions    Diet general   Complete by: As directed    Discharge instructions   Complete by: As directed    F/u with oncology, radiation  therapy.  Please call call MD or return to ER for similar or worsening recurring problem that brought you to hospital or if any fever,nausea/vomiting,abdominal pain, uncontrolled pain, chest pain,  shortness of breath or any other alarming symptoms.  Please follow-up your doctor as instructed in a week time and call the office for appointment.  Please avoid alcohol, smoking, or any other illicit substance and maintain healthy habits including taking your regular medications as prescribed.  You were cared for by a hospitalist during your hospital stay. If you have any questions about your discharge medications or the care you received while you were in the hospital after you are discharged, you can call the unit and ask to speak with the hospitalist on call if the hospitalist that took care of you is not available.  Once you are discharged, your primary care physician will handle any further medical issues. Please note that NO REFILLS for any discharge medications will be authorized once you are discharged, as it is imperative that you return to your primary care physician (or establish a relationship with a primary care physician if you do not have one) for your aftercare needs so that they can reassess your need for medications and monitor your lab values   Increase activity slowly   Complete by: As directed      Allergies as of 05/04/2020   No Known Allergies     Medication List    TAKE these medications   acetaminophen 650 MG CR tablet Commonly known as: TYLENOL Take 650 mg by mouth every 8 (eight) hours as needed for pain.   citalopram 40 MG tablet Commonly known as: CeleXA Take 1 tablet (40 mg total) by mouth daily. What changed: how much to take   dexamethasone 4 MG tablet Commonly known as: DECADRON Take 1 tablet (4 mg total) by mouth 4 (four) times daily.   dicyclomine 20 MG tablet Commonly known as: BENTYL Take 1 tablet (20 mg total) by mouth 4 (four) times daily as needed  for spasms. What changed:   how much to take  when to take this   diphenhydramine-acetaminophen 25-500 MG Tabs tablet Commonly known as: TYLENOL PM Take 1 tablet by mouth at bedtime as needed (sleep).   docusate sodium 100 MG capsule Commonly known as: COLACE Take 100 mg by mouth daily as needed for mild constipation.   Ensure Take 1 Can by mouth daily.   ferrous sulfate 325 (65 FE) MG tablet Take 325 mg by mouth daily with breakfast.   MENS PROSTATE HEALTH FORMULA PO Take 2 capsules by mouth daily.   MULTI VITAMIN DAILY PO Take 1 tablet by mouth daily.   nicotine polacrilex 4 MG gum Commonly known as: NICORETTE Take 4 mg by mouth as needed for smoking cessation.   omeprazole 20 MG capsule Commonly known as: PRILOSEC Take 1 capsule (20 mg total) by mouth 2 (two) times daily before a meal. What changed: when to take this   ondansetron 4 MG tablet Commonly known as: Zofran Take 1 tablet (4 mg total) by mouth every 8 (eight) hours as needed for nausea or vomiting.   PRESCRIPTION MEDICATION Place 1 application rectally daily as needed (hemorrhoids). Hydrocortisone 2.5 % Lidocaine 5%  Phenylephrine 0.25% Pramoxine 1% rectal cream   tamsulosin 0.4 MG Caps capsule Commonly known as: FLOMAX Take 1 capsule (0.4 mg total) by mouth daily after supper. What changed: when to take this   traMADol 50 MG tablet Commonly known as: ULTRAM Take 1 tablet (50 mg total) by mouth every 6 (six) hours as needed for up to 5 days for moderate pain.       Follow-up Information    Curt Bears, MD. Call.   Specialty: Oncology Contact information: [place] [address] [phone]        Evelina Dun A, FNP Follow up in 1 week(s).   Specialty: Family Medicine Contact information: [place] [address] [phone]              No Known Allergies  The results of significant diagnostics from this hospitalization  (including imaging, microbiology, ancillary and laboratory) are listed below for reference.    Microbiology: Recent Results (from the past 240 hour(s))  SARS Coronavirus 2 by RT PCR (hospital order, performed in [facility] hospital lab) Nasopharyngeal Nasopharyngeal Swab     Status: None   Collection Time: 04/30/20  2:09 PM   Specimen: Nasopharyngeal Swab  Result Value Ref Range Status   SARS Coronavirus 2 NEGATIVE NEGATIVE Final    Comment: (NOTE) SARS-CoV-2 target nucleic acids are NOT DETECTED.  The SARS-CoV-2 RNA is generally detectable in upper and lower respiratory specimens during the acute phase of infection. The lowest concentration of SARS-CoV-2 viral copies this assay can detect is 250 copies / mL. A negative result does not preclude SARS-CoV-2 infection and should not be used as the sole basis for treatment or other patient management decisions.  A negative result may occur with improper specimen collection / handling, submission of specimen other than nasopharyngeal swab, presence of viral mutation(s) within the areas targeted by this assay, and inadequate number of viral copies (<250 copies / mL). A negative result must be combined with clinical observations, patient history, and epidemiological information.  Fact Sheet for Patients:   StrictlyIdeas.no  Fact Sheet for Healthcare Providers: BankingDealers.co.za  This test is not yet approved or  cleared by the [place] FDA and has been authorized for detection and/or diagnosis of SARS-CoV-2 by FDA under an Emergency Use Authorization (EUA).  This EUA will remain in effect (meaning this test can be used) for the duration of the COVID-19 declaration under Section 564(b)(1) of the Act, 21 U.S.C. section 360bbb-3(b)(1), unless the authorization is terminated or revoked sooner.  Performed at [facility], [address] [place], [place] [address]   Surgical PCR screen      Status: None   Collection Time: 04/30/20 11:51 PM   Specimen: Nasal Mucosa; Nasal Swab  Result Value Ref Range Status   MRSA, PCR NEGATIVE NEGATIVE Final   Staphylococcus aureus NEGATIVE NEGATIVE Final    Comment: (NOTE) The Xpert SA Assay (FDA approved for NASAL specimens in patients 36 years of age and older), is one component of a comprehensive surveillance program. It is not intended to diagnose infection nor to guide or monitor treatment. Performed at [facility], [place] [address] [place], [place] [address]     Procedures/Studies: CT ABDOMEN PELVIS W WO CONTRAST  Result Date: 04/28/2020 CLINICAL DATA:  Follow-up pyelonephritis EXAM: CT CHEST WITH CONTRAST CT ABDOMEN AND PELVIS WITHOUT AND WITH CONTRAST TECHNIQUE: Multidetector CT imaging of the chest was performed following the standard protocol following the bolus administration  of intravenous contrast. Multidetector CT imaging of the abdomen and pelvis was performed following the standard protocol before and following the bolus administration of intravenous contrast. Additional oral enteric contrast was administered. CONTRAST:  136mL OMNIPAQUE IOHEXOL 300 MG/ML SOLN, additional oral enteric contrast COMPARISON:  CT abdomen pelvis, 03/21/2020 FINDINGS: CT CHEST Cardiovascular: Aortic atherosclerosis. Three-vessel coronary artery calcifications. Normal heart size. No pericardial effusion. Mediastinum/Nodes: Enlarged subcarinal and bilateral hilar lymph nodes, measuring up to 2.9 x 1.6 cm (series 12, image 56). Moderate hiatal hernia with intrathoracic position of the gastric fundus. Thyroid gland, trachea, and esophagus demonstrate no significant findings. Lungs/Pleura: Mild centrilobular emphysema. There is a large, heterogeneously hyperdense left hilar mass, measuring approximately 8.2 x 6.0 x 7.7 cm. There is obstruction of the left lower lobe bronchus and total atelectasis of the left lower lobe. Trace left pleural effusion.  Clustered postobstructive nodularity in the left pulmonary apex (series 14, image 27). Musculoskeletal: No chest wall mass or suspicious bone lesions identified. CT ABDOMEN AND PELVIS Hepatobiliary: No solid liver abnormality is seen. No gallstones, gallbladder wall thickening, or biliary dilatation. Pancreas: Unremarkable. No pancreatic ductal dilatation or surrounding inflammatory changes. Spleen: Normal in size without significant abnormality. Adrenals/Urinary Tract: Adrenal glands are unremarkable. There has been a significant increase in infiltrative appearing, hypodense lesion of the left kidney, which now occupies most of the kidney and measures approximately 8.9 x 5.7 x 5.3 cm (series 10, image 66). There is an additional lesion of the superior pole of the right kidney, which is now much more clearly appreciated than subtle appearance on prior examination, measuring 5.3 x 4.9 x 4.8 cm (series 10, image 70). Bladder is unremarkable. Stomach/Bowel: Stomach is within normal limits. Appendix appears normal. No evidence of bowel wall thickening, distention, or inflammatory changes. Sigmoid diverticula. Vascular/Lymphatic: Aortic atherosclerosis. 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 Additional aneurysm of the bilateral common iliac arteries. No enlarged abdominal or pelvic lymph nodes. Reproductive: No mass or other significant abnormality. Other: No abdominal wall hernia or abnormality. No abdominopelvic ascites. Musculoskeletal: No acute or significant osseous findings. IMPRESSION: 1. There is a large, heterogeneously hyperdense left hilar mass, measuring approximately 8.2 cm. There is obstruction of the left lower lobe bronchus and total atelectasis of the left lower lobe. 2. Enlarged subcarinal and bilateral hilar lymph nodes, suspicious for nodal metastatic disease. 3. There has been a significant  increase in infiltrative appearing, hypodense lesion of the left kidney, which now occupies most of the kidney and measures approximately 8.9 cm. There is an additional lesion of the superior pole of the right kidney, which is now much more clearly appreciated than subtle appearance on prior examination, measuring 5.3 cm. 4. 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 No other evidence of distant metastatic disease is appreciated. 5. 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 Recommend surgical consultation. Aortic aneurysm NOS (ICD10-I71.9). Aortic Atherosclerosis (ICD10-I70.0). 6. Emphysema (ICD10-J43.9). 7. Coronary artery disease. These results will be called to the ordering clinician or representative by the Radiologist Assistant, and communication documented in the PACS or [employer]. Electronically Signed   By: Eddie Candle M.D.   On: 04/28/2020 12:07   DG Chest 2 View  Result Date: 04/30/2020 CLINICAL DATA:  Hemoptysis for 1 week EXAM:  CHEST - 2 VIEW COMPARISON:  Chest x-ray from April 13, 2020, CT chest from April 27, 2020 FINDINGS: Added density along the LEFT lower lobe, superior segment partially visualized on abdominal CT showing no change and undulating borders on the lateral view. Cardiomediastinal contours are otherwise stable. Increased density in the retrocardiac region also likely due in part to hiatal hernia. Signs of compression fracture at T12 similar to prior imaging. RIGHT lung remains clear. IMPRESSION: 1. Stable chest.  Without acute changes since April 13, 2020. 2. Known mass at the LEFT hilum with associated volume loss is similar to the prior study 3. Stable hiatal hernia. 4. Signs of compression fracture in the lower thoracic spine as before. Electronically Signed   By: Zetta Bills M.D.    On: 04/30/2020 13:08   DG Chest 2 View  Result Date: 04/13/2020 CLINICAL DATA:  Community acquired pneumonia EXAM: CHEST - 2 VIEW COMPARISON:  01/02/2017, CT from 03/21/2020 FINDINGS: Cardiac shadow is stable. Tortuous thoracic aorta with calcifications is seen. Hiatal hernia is again noted. Increased soft tissue density is noted over the thoracic spine posteriorly best noted on the lateral projection but overlapping the hila on the frontal. No focal infiltrate is seen. These changes are highly suspicious for underlying mass particularly given the patient's history of smoking. Previously seen lower lobe consolidation on CT has improved. IMPRESSION: Masslike density in the left lower lobe superior segment best visualized on the lateral projection. CT of the chest with contrast is recommended for further evaluation. These results will be called to the ordering clinician or representative by the Radiologist Assistant, and communication documented in the PACS or [employer]. Electronically Signed   By: Inez Catalina M.D.   On: 04/13/2020 15:09   CT CHEST W CONTRAST  Result Date: 04/28/2020 CLINICAL DATA:  Follow-up pyelonephritis EXAM: CT CHEST WITH CONTRAST CT ABDOMEN AND PELVIS WITHOUT AND WITH CONTRAST TECHNIQUE: Multidetector CT imaging of the chest was performed following the standard protocol following the bolus administration of intravenous contrast. Multidetector CT imaging of the abdomen and pelvis was performed following the standard protocol before and following the bolus administration of intravenous contrast. Additional oral enteric contrast was administered. CONTRAST:  173mL OMNIPAQUE IOHEXOL 300 MG/ML SOLN, additional oral enteric contrast COMPARISON:  CT abdomen pelvis, 03/21/2020 FINDINGS: CT CHEST Cardiovascular: Aortic atherosclerosis. Three-vessel coronary artery calcifications. Normal heart size. No pericardial effusion. Mediastinum/Nodes: Enlarged subcarinal and bilateral hilar lymph  nodes, measuring up to 2.9 x 1.6 cm (series 12, image 56). Moderate hiatal hernia with intrathoracic position of the gastric fundus. Thyroid gland, trachea, and esophagus demonstrate no significant findings. Lungs/Pleura: Mild centrilobular emphysema. There is a large, heterogeneously hyperdense left hilar mass, measuring approximately 8.2 x 6.0 x 7.7 cm. There is obstruction of the left lower lobe bronchus and total atelectasis of the left lower lobe. Trace left pleural effusion. Clustered postobstructive nodularity in the left pulmonary apex (series 14, image 27). Musculoskeletal: No chest wall mass or suspicious bone lesions identified. CT ABDOMEN AND PELVIS Hepatobiliary: No solid liver abnormality is seen. No gallstones, gallbladder wall thickening, or biliary dilatation. Pancreas: Unremarkable. No pancreatic ductal dilatation or surrounding inflammatory changes. Spleen: Normal in size without significant abnormality. Adrenals/Urinary Tract: Adrenal glands are unremarkable. There has been a significant increase in infiltrative appearing, hypodense lesion of the left kidney, which now occupies most of the kidney and measures approximately 8.9 x 5.7 x 5.3 cm (series 10, image 66). There is an additional lesion of the superior pole of the  right kidney, which is now much more clearly appreciated than subtle appearance on prior examination, measuring 5.3 x 4.9 x 4.8 cm (series 10, image 70). Bladder is unremarkable. Stomach/Bowel: Stomach is within normal limits. Appendix appears normal. No evidence of bowel wall thickening, distention, or inflammatory changes. Sigmoid diverticula. Vascular/Lymphatic: Aortic atherosclerosis. 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 Additional aneurysm of the bilateral common iliac arteries. No enlarged abdominal or pelvic lymph nodes. Reproductive: No mass or other significant  abnormality. Other: No abdominal wall hernia or abnormality. No abdominopelvic ascites. Musculoskeletal: No acute or significant osseous findings. IMPRESSION: 1. There is a large, heterogeneously hyperdense left hilar mass, measuring approximately 8.2 cm. There is obstruction of the left lower lobe bronchus and total atelectasis of the left lower lobe. 2. Enlarged subcarinal and bilateral hilar lymph nodes, suspicious for nodal metastatic disease. 3. There has been a significant increase in infiltrative appearing, hypodense lesion of the left kidney, which now occupies most of the kidney and measures approximately 8.9 cm. There is an additional lesion of the superior pole of the right kidney, which is now much more clearly appreciated than subtle appearance on prior examination, measuring 5.3 cm. 4. 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 No other evidence of distant metastatic disease is appreciated. 5. 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 Recommend surgical consultation. Aortic aneurysm NOS (ICD10-I71.9). Aortic Atherosclerosis (ICD10-I70.0). 6. Emphysema (ICD10-J43.9). 7. Coronary artery disease. These results will be called to the ordering clinician or representative by the Radiologist Assistant, and communication documented in the PACS or [employer]. Electronically Signed   By: Eddie Candle M.D.   On: 04/28/2020 12:07   MR BRAIN W WO CONTRAST  Addendum Date: 04/30/2020   ADDENDUM REPORT: 04/30/2020 20:25 ADDENDUM: These results were called by telephone at the time of interpretation on 04/30/2020 at 8:25 pm to provider NP Lang Snow , who verbally acknowledged these results. Electronically Signed   By: Kellie Simmering DO   On: 04/30/2020 20:25   Result Date: 04/30/2020 CLINICAL DATA:  Headache, acute,  normal neuro exam. Additional history provided: Left-sided hilar mass, subcarinal and hilar lymph node enlargement, bilateral renal lesions. EXAM: MRI HEAD WITHOUT AND WITH CONTRAST TECHNIQUE: Multiplanar, multiecho pulse sequences of the brain and surrounding structures were obtained without and with intravenous contrast. CONTRAST:  40mL GADAVIST GADOBUTROL 1 MMOL/ML IV SOLN COMPARISON:  No pertinent prior studies available for comparison. 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 These lesions predominately demonstrate peripheral enhancement and corresponding restricted diffusion. These lesions are as follows. Peripherally enhancing, centrally necrotic mass centered within the right basal ganglia measuring 3.5 x 3.4 x 3.3 cm (series 10, image 31). There is prominent surrounding edema within the basal ganglia, also extending into the adjacent white matter tracks, right thalamus, anterior corpus callosum, right temporal lobe and into the midbrain. There is associated mass effect with partial effacement of the lateral and third ventricles. 9 mm leftward midline shift measured at the level of the septum pellucidum. There is leftward subfalcine herniation. At least 20 additional subcentimeter peripherally enhancing metastases are identified within the supratentorial and infratentorial brain (please see annotations on the axial postcontrast T1 weighted sequence. Some these additional metastatic lesions have mild  associated edema. Of note, one of these metastases is present within the posterior midbrain near the cerebral aqueduct (series 10, images 23-24). There is no acute infarct. No evidence of intracranial mass. No chronic intracranial blood products. No extra-axial fluid collection. Vascular: Expected proximal arterial flow voids. Non dominant intracranial right vertebral artery. Skull and upper cervical spine: No focal marrow lesion.  Sinuses/Orbits: Visualized orbits show no acute finding. Mild paranasal sinus mucosal thickening, most notably ethmoid. No significant mastoid effusion. IMPRESSION: At least 21 supratentorial and infratentorial enhancing parenchymal lesions are identified. 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 A dominant 3.5 cm lesion centered within the right basal ganglia has prominent surrounding edema. There is associated mass effect with partial effacement of the right lateral and third ventricles, and 9 mm leftward midline shift. Associated leftward subfalcine herniation. The remaining metastases are subcentimeter with some of these lesions having mild associated edema. Of note, one of these lesions is located within the posterior midbrain adjacent to the cerebral aqueduct. No evidence of hydrocephalus at this time. Mild paranasal sinus mucosal thickening. Electronically Signed: By: Kellie Simmering DO On: 04/30/2020 20:03    Labs: BNP (last 3 results) No results for input(s): BNP in the last 8760 hours. Basic Metabolic Panel: Recent Labs  Lab 04/30/20 1153 04/30/20 2100 05/02/20 0320  NA 133* 131* 132*  K 4.3 3.9 4.6  CL 94* 95* 99  CO2 26 24 24   GLUCOSE 96 139* 113*  BUN 13 12 15   CREATININE 0.96 1.13 0.89  CALCIUM 10.6* 9.7 9.4   Liver Function Tests: Recent Labs  Lab 04/30/20 1153 04/30/20 2100 05/02/20 0320  AST 12* 16 13*  ALT 13 14 16   ALKPHOS 75 73 61  BILITOT 0.5 0.4 0.1*  PROT 7.7 6.7 6.0*  ALBUMIN 3.2* 2.6* 2.3*   No results for input(s): LIPASE, AMYLASE in the last 168 hours. No results for input(s): AMMONIA in the last 168 hours. CBC: Recent Labs  Lab 04/30/20 1153 04/30/20 2100 05/02/20 0320  WBC 13.5* 14.4* 9.4  NEUTROABS 11.0*  --   --   HGB 9.7* 9.3* 8.5*  HCT 32.7* 30.3* 28.4*  MCV 79.0* 78.1* 78.2*  PLT 490* 462* 409*   Cardiac Enzymes: No results for input(s): CKTOTAL, CKMB, CKMBINDEX, TROPONINI in the last 168  hours. BNP: Invalid input(s): POCBNP CBG: No results for input(s): GLUCAP in the last 168 hours. D-Dimer No results for input(s): DDIMER in the last 72 hours. Hgb A1c No results for input(s): HGBA1C in the last 72 hours. Lipid Profile No results for input(s): CHOL, HDL, LDLCALC, TRIG, CHOLHDL, LDLDIRECT in the last 72 hours. Thyroid function studies No results for input(s): TSH, T4TOTAL, T3FREE, THYROIDAB in the last 72 hours.  Invalid input(s): FREET3 Anemia work up No results for input(s): VITAMINB12, FOLATE, FERRITIN, TIBC, IRON, RETICCTPCT in the last 72 hours. Urinalysis    Component Value Date/Time   COLORURINE YELLOW 04/30/2020 1137   APPEARANCEUR CLEAR 04/30/2020 1137   APPEARANCEUR Clear 03/22/2020 1028   LABSPEC 1.012 04/30/2020 1137   PHURINE 7.0 04/30/2020 1137   GLUCOSEU NEGATIVE 04/30/2020 1137   HGBUR NEGATIVE 04/30/2020 1137   BILIRUBINUR NEGATIVE 04/30/2020 1137   BILIRUBINUR Negative 03/22/2020 [place] 04/30/2020 1137   PROTEINUR NEGATIVE 04/30/2020 1137   NITRITE NEGATIVE 04/30/2020 1137   LEUKOCYTESUR TRACE (A) 04/30/2020 1137   Sepsis Labs Invalid input(s): PROCALCITONIN,  WBC,  LACTICIDVEN Microbiology Recent Results (from the past 240 hour(s))  SARS Coronavirus  2 by RT PCR (hospital order, performed in [facility] hospital lab) Nasopharyngeal Nasopharyngeal Swab     Status: None   Collection Time: 04/30/20  2:09 PM   Specimen: Nasopharyngeal Swab  Result Value Ref Range Status   SARS Coronavirus 2 NEGATIVE NEGATIVE Final    Comment: (NOTE) SARS-CoV-2 target nucleic acids are NOT DETECTED.  The SARS-CoV-2 RNA is generally detectable in upper and lower respiratory specimens during the acute phase of infection. The lowest concentration of SARS-CoV-2 viral copies this assay can detect is 250 copies / mL. A negative result does not preclude SARS-CoV-2 infection and should not be used as the sole basis for treatment or  other patient management decisions.  A negative result may occur with improper specimen collection / handling, submission of specimen other than nasopharyngeal swab, presence of viral mutation(s) within the areas targeted by this assay, and inadequate number of viral copies (<250 copies / mL). A negative result must be combined with clinical observations, patient history, and epidemiological information.  Fact Sheet for Patients:   StrictlyIdeas.no  Fact Sheet for Healthcare Providers: BankingDealers.co.za  This test is not yet approved or  cleared by the [place] FDA and has been authorized for detection and/or diagnosis of SARS-CoV-2 by FDA under an Emergency Use Authorization (EUA).  This EUA will remain in effect (meaning this test can be used) for the duration of the COVID-19 declaration under Section 564(b)(1) of the Act, 21 U.S.C. section 360bbb-3(b)(1), unless the authorization is terminated or revoked sooner.  Performed at [facility], [address], [place], [place] [address]   Surgical PCR screen     Status: None   Collection Time: 04/30/20 11:51 PM   Specimen: Nasal Mucosa; Nasal Swab  Result Value Ref Range Status   MRSA, PCR NEGATIVE NEGATIVE Final   Staphylococcus aureus NEGATIVE NEGATIVE Final    Comment: (NOTE) The Xpert SA Assay (FDA approved for NASAL specimens in patients 88 years of age and older), is one component of a comprehensive surveillance program. It is not intended to diagnose infection nor to guide or monitor treatment. Performed at [facility], [place] [address] [place], [place] [address]      Time coordinating discharge: 25  minutes  SIGNED: Antonieta Pert, MD  [facility] 05/04/2020, 11:43 AM  If 7PM-7AM, please contact night-coverage [email or web address]

## 2020-05-04 NOTE — Progress Notes (Signed)
Brief oncology note:  Asked by nursing and hospitalist to stop by and talk with the family.  Unfortunately, family was not in the room at the time Dr. Worthy Flank visit.  They would like updates as to the tentative plan moving forward from medical oncology standpoint.  Reviewed Dr. Worthy Flank recommendations with the patient and his family members.  The patient will continue radiation under the care of Dr. Tammi Klippel.  The patient will need a complete staging work-up including a PET scan as an outpatient.  Reviewed treatment options that were outlined by Dr. Julien Nordmann earlier today including palliative care and hospice referral versus palliative systemic chemotherapy with carboplatin, paclitaxel, and Keytruda every 3 weeks after completion of brain radiation.  The patient has expressed interest in pursuing systemic treatment.  The patient and his family have indicated they would like a follow-up appointment in [place] with Dr. Julien Nordmann closer to the end of radiation for more detailed discussion of treatment options.  We will let Dr. Delton Coombes know of the patient's wishes.  We will contact the patient with the date and time of his appointment in our office.  Mikey Bussing, DNP, AGPCNP-BC, AOCNP Mon/Tues/Thurs/Fri 7am-5pm; Off Wednesdays Cell: [phone]

## 2020-05-07 ENCOUNTER — Ambulatory Visit
Admission: RE | Admit: 2020-05-07 | Discharge: 2020-05-07 | Disposition: A | Discharge status: Home / Self Care | Payer: Medicare HMO | Source: Ambulatory Visit | Attending: Radiation Oncology | Admitting: Radiation Oncology

## 2020-05-07 ENCOUNTER — Other Ambulatory Visit: Payer: Self-pay

## 2020-05-07 ENCOUNTER — Other Ambulatory Visit (HOSPITAL_COMMUNITY): Payer: Medicare HMO

## 2020-05-07 ENCOUNTER — Encounter: Payer: Self-pay | Admitting: *Deleted

## 2020-05-07 ENCOUNTER — Telehealth: Payer: Self-pay | Admitting: Internal Medicine

## 2020-05-07 ENCOUNTER — Telehealth: Payer: Self-pay | Admitting: Radiation Oncology

## 2020-05-07 DIAGNOSIS — C7931 Secondary malignant neoplasm of brain: Secondary | ICD-10-CM | POA: Diagnosis not present

## 2020-05-07 DIAGNOSIS — C3402 Malignant neoplasm of left main bronchus: Secondary | ICD-10-CM | POA: Diagnosis not present

## 2020-05-07 NOTE — Telephone Encounter (Signed)
Patient is coming in today for radiation treatment at 5:40pm. He is having moderate to severe abdominal pain and nausea. He is eating very little and threw up yesterday.Spouse is wondering what to do. Letting nurse know to please reach out to her. Burman Nieves is at [phone].

## 2020-05-07 NOTE — Telephone Encounter (Signed)
Scheduled appt per 7/12 sch msg - unable to reach pt .left message with appt date and time

## 2020-05-07 NOTE — Progress Notes (Signed)
Ms. Bultman is currently out of the hospital.  I updated scheduling to call and schedule a follow up to see Dr. Julien Nordmann tomorrow.

## 2020-05-07 NOTE — Telephone Encounter (Signed)
-----   Message from Hollace Kinnier sent at 05/07/2020  8:25 AM EDT ----- Regarding: Patient having problems/symptoms Michael Horton Patient is coming in today for treatment at 5:40pm. He is having moderate to severe abdominal pain and nausea. He is eating very little and threw up yesterday.Spouse is wondering what to do. Can someone please reach out to her? Burman Nieves is at [phone].  Thank you!  Cecille Rubin

## 2020-05-07 NOTE — Telephone Encounter (Signed)
Long discussion with wife. Support given.

## 2020-05-07 NOTE — Telephone Encounter (Signed)
Received Epic inbasket message from [place] that this patient's wife, Burman Nieves, request a return call. Phoned [name] at [phone] to inquire. She reports her husband has moderate to severe abdominal cramping and pain (this same symptom was mentioned during initial consult). She reports he is eating very little due to nausea. She reports he vomited yesterday. She questions if the lesions on her husband's kidneys could be causing the abdominal pain. She reports he has taken his dulcolax, celexa, prilosec and decadron (4 mg qid) already today and kept those down. 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 [name] wants to know how to best manage her husbands symptoms and the cause of them.

## 2020-05-08 ENCOUNTER — Telehealth: Payer: Self-pay | Admitting: Radiation Oncology

## 2020-05-08 ENCOUNTER — Encounter: Payer: Self-pay | Admitting: *Deleted

## 2020-05-08 ENCOUNTER — Inpatient Hospital Stay: Payer: Medicare HMO | Admitting: Internal Medicine

## 2020-05-08 ENCOUNTER — Other Ambulatory Visit: Payer: Self-pay

## 2020-05-08 ENCOUNTER — Other Ambulatory Visit (HOSPITAL_COMMUNITY): Payer: Medicare HMO

## 2020-05-08 ENCOUNTER — Telehealth: Payer: Self-pay | Admitting: Medical Oncology

## 2020-05-08 ENCOUNTER — Inpatient Hospital Stay: Payer: Medicare HMO

## 2020-05-08 ENCOUNTER — Ambulatory Visit
Admission: RE | Admit: 2020-05-08 | Discharge: 2020-05-08 | Disposition: A | Discharge status: Home / Self Care | Payer: Medicare HMO | Source: Ambulatory Visit | Attending: Radiation Oncology | Admitting: Radiation Oncology

## 2020-05-08 DIAGNOSIS — C3402 Malignant neoplasm of left main bronchus: Secondary | ICD-10-CM

## 2020-05-08 DIAGNOSIS — C7931 Secondary malignant neoplasm of brain: Secondary | ICD-10-CM | POA: Diagnosis not present

## 2020-05-08 NOTE — Telephone Encounter (Signed)
Wife could not get pt up today. He is in pain and  very weak and combative.  She requests to reschedule his new pt appt with [facility]. She is not sure he wants chemo.

## 2020-05-08 NOTE — Progress Notes (Signed)
Per Dr. Julien Nordmann, vo PET.  Order completed

## 2020-05-08 NOTE — Telephone Encounter (Signed)
While awaiting response to inbasket sent to providers this RN received one from [name], RN about this patient. Phoned wife, Michael Horton, to inquire further. Michael Horton explains her husband isn't combative or confused simply exhausted from constant pain and lack of sleep making him argumentative. Michael Horton reports her husband continues to complain of abdominal pain and refuses to eat or drink. She reports her husband hasn't had a bowel movement in three days despite dulcolax and one dose of Miralax. She reports speaking with [name], FNP yesterday and "that she is perplexed about what could be the cause of the abdominal pain." Michael Horton reports her husband was scheduled for a colonoscopy to evaluate this pain but it had to be cancelled when he got hospitalized. Agreed to determine with radiology if PET could be done earlier than 7/20 and inquire about a tramadol refill.   Phoned Michael Horton back. Explained that per [name] in nuclear medicine there are no other openings for PET at this time but should someone cancel she will call them. Also, explained that per [name], PA-C she should continue current regimen of dulcolax, celexa, prilosec, decadron, dicyclomine and ondansetron. Per [name]'s request explained she should add miralax every six hours until bowel movement to the current regimen. Went onto explain if her husband's abdominal pain doesn't resolve with BM Ashlyn will refill the tramadol. Explained tramadol can constipate and if refilled now may complicate the current problem. Michael Horton agreed with this plan and that this RN could check in with her tomorrow about her husband's status. Michael Horton expressed appreciation for the help.

## 2020-05-09 ENCOUNTER — Other Ambulatory Visit: Payer: Self-pay

## 2020-05-09 ENCOUNTER — Encounter: Payer: Self-pay | Admitting: *Deleted

## 2020-05-09 ENCOUNTER — Telehealth: Payer: Self-pay

## 2020-05-09 ENCOUNTER — Telehealth: Payer: Self-pay | Admitting: Radiation Oncology

## 2020-05-09 ENCOUNTER — Emergency Department (HOSPITAL_COMMUNITY)
Admission: EM | Admit: 2020-05-09 | Discharge: 2020-05-09 | Disposition: A | Discharge status: Home / Self Care | Payer: Medicare HMO | Attending: Emergency Medicine | Admitting: Emergency Medicine

## 2020-05-09 ENCOUNTER — Ambulatory Visit
Admission: RE | Admit: 2020-05-09 | Discharge: 2020-05-09 | Disposition: A | Discharge status: Home / Self Care | Payer: Medicare HMO | Source: Ambulatory Visit | Attending: Radiation Oncology | Admitting: Radiation Oncology

## 2020-05-09 ENCOUNTER — Encounter: Payer: Self-pay | Admitting: Radiation Oncology

## 2020-05-09 ENCOUNTER — Emergency Department (HOSPITAL_COMMUNITY): Payer: Medicare HMO

## 2020-05-09 ENCOUNTER — Encounter (HOSPITAL_COMMUNITY): Payer: Self-pay

## 2020-05-09 DIAGNOSIS — Z85118 Personal history of other malignant neoplasm of bronchus and lung: Secondary | ICD-10-CM | POA: Insufficient documentation

## 2020-05-09 DIAGNOSIS — R109 Unspecified abdominal pain: Secondary | ICD-10-CM | POA: Diagnosis not present

## 2020-05-09 DIAGNOSIS — C7901 Secondary malignant neoplasm of right kidney and renal pelvis: Secondary | ICD-10-CM | POA: Diagnosis not present

## 2020-05-09 DIAGNOSIS — C801 Malignant (primary) neoplasm, unspecified: Secondary | ICD-10-CM | POA: Diagnosis not present

## 2020-05-09 DIAGNOSIS — R112 Nausea with vomiting, unspecified: Secondary | ICD-10-CM | POA: Diagnosis not present

## 2020-05-09 DIAGNOSIS — C7931 Secondary malignant neoplasm of brain: Secondary | ICD-10-CM | POA: Diagnosis not present

## 2020-05-09 DIAGNOSIS — K5900 Constipation, unspecified: Secondary | ICD-10-CM | POA: Diagnosis not present

## 2020-05-09 DIAGNOSIS — Z87891 Personal history of nicotine dependence: Secondary | ICD-10-CM | POA: Diagnosis not present

## 2020-05-09 DIAGNOSIS — Z79899 Other long term (current) drug therapy: Secondary | ICD-10-CM | POA: Insufficient documentation

## 2020-05-09 DIAGNOSIS — R194 Change in bowel habit: Secondary | ICD-10-CM

## 2020-05-09 DIAGNOSIS — C7902 Secondary malignant neoplasm of left kidney and renal pelvis: Secondary | ICD-10-CM | POA: Diagnosis not present

## 2020-05-09 LAB — COMPREHENSIVE METABOLIC PANEL
ALT: 20 U/L (ref 0–44)
AST: 15 U/L (ref 15–41)
Albumin: 3.4 g/dL — ABNORMAL LOW (ref 3.5–5.0)
Alkaline Phosphatase: 71 U/L (ref 38–126)
Anion gap: 11 (ref 5–15)
BUN: 25 mg/dL — ABNORMAL HIGH (ref 8–23)
CO2: 31 mmol/L (ref 22–32)
Calcium: 10.2 mg/dL (ref 8.9–10.3)
Chloride: 87 mmol/L — ABNORMAL LOW (ref 98–111)
Creatinine, Ser: 0.91 mg/dL (ref 0.61–1.24)
GFR calc Af Amer: >60 mL/min (ref >60)
GFR calc non Af Amer: >60 mL/min (ref >60)
Glucose, Bld: 122 mg/dL — ABNORMAL HIGH (ref 70–99)
Potassium: 5.3 mmol/L — ABNORMAL HIGH (ref 3.5–5.1)
Sodium: 129 mmol/L — ABNORMAL LOW (ref 135–145)
Total Bilirubin: 0.6 mg/dL (ref 0.3–1.2)
Total Protein: 7.1 g/dL (ref 6.5–8.1)

## 2020-05-09 LAB — CBC WITH DIFFERENTIAL/PLATELET
Abs Immature Granulocytes: 0.47 10*3/uL — ABNORMAL HIGH (ref 0.00–0.07)
Basophils Absolute: 0.1 10*3/uL (ref 0.0–0.1)
Basophils Relative: 0 %
Eosinophils Absolute: 0 10*3/uL (ref 0.0–0.5)
Eosinophils Relative: 0 %
HCT: 33.9 % — ABNORMAL LOW (ref 39.0–52.0)
Hemoglobin: 10.6 g/dL — ABNORMAL LOW (ref 13.0–17.0)
Immature Granulocytes: 3 %
Lymphocytes Relative: 2 %
Lymphs Abs: 0.3 10*3/uL — ABNORMAL LOW (ref 0.7–4.0)
MCH: 24.1 pg — ABNORMAL LOW (ref 26.0–34.0)
MCHC: 31.3 g/dL (ref 30.0–36.0)
MCV: 77.2 fL — ABNORMAL LOW (ref 80.0–100.0)
Monocytes Absolute: 0.8 10*3/uL (ref 0.1–1.0)
Monocytes Relative: 5 %
Neutro Abs: 14.4 10*3/uL — ABNORMAL HIGH (ref 1.7–7.7)
Neutrophils Relative %: 90 %
Platelets: 442 10*3/uL — ABNORMAL HIGH (ref 150–400)
RBC: 4.39 MIL/uL (ref 4.22–5.81)
RDW: 19.4 % — ABNORMAL HIGH (ref 11.5–15.5)
WBC: 15.9 10*3/uL — ABNORMAL HIGH (ref 4.0–10.5)
nRBC: 0 % (ref 0.0–0.2)

## 2020-05-09 LAB — URINALYSIS, ROUTINE W REFLEX MICROSCOPIC
Bilirubin Urine: NEGATIVE
Glucose, UA: NEGATIVE mg/dL
Hgb urine dipstick: NEGATIVE
Ketones, ur: NEGATIVE mg/dL
Nitrite: NEGATIVE
Protein, ur: NEGATIVE mg/dL
Specific Gravity, Urine: 1.024 (ref 1.005–1.030)
WBC, UA: >50 WBC/hpf — ABNORMAL HIGH (ref 0–5)
pH: 7 (ref 5.0–8.0)

## 2020-05-09 MED ORDER — SODIUM CHLORIDE (PF) 0.9 % IJ SOLN
INTRAMUSCULAR | Status: AC
  Filled 2020-05-09: qty 50

## 2020-05-09 MED ORDER — FLUCONAZOLE 100 MG PO TABS
100.0000 mg | ORAL_TABLET | Freq: Every day | ORAL | 0 refills | Status: AC
Start: 2020-05-09 — End: 2020-05-16

## 2020-05-09 MED ORDER — SODIUM CHLORIDE 0.9 % IV SOLN: INTRAVENOUS | Status: DC

## 2020-05-09 MED ORDER — DEXAMETHASONE 4 MG PO TABS
4.0000 mg | ORAL_TABLET | Freq: Two times a day (BID) | ORAL | 0 refills | Status: AC
Start: 2020-05-09 — End: ?

## 2020-05-09 MED ORDER — IOHEXOL 300 MG/ML  SOLN
100.0000 mL | Freq: Once | INTRAMUSCULAR | Status: AC | PRN
  Administered 2020-05-09: 100 mL via INTRAVENOUS

## 2020-05-09 NOTE — Telephone Encounter (Signed)
Dicyclomen was marked as not taken on pts med list per AB.

## 2020-05-09 NOTE — ED Notes (Signed)
Unable to sign Customer service manager. Screen froze. Discharge instructions reviewed with pt. Pt and wife verbalized understanding. Pt ambulatory with wife on d/c.

## 2020-05-09 NOTE — ED Provider Notes (Signed)
[facility] DEPT Provider Note   CSN: [id number] Arrival date & time: 05/09/20  1803     History Chief Complaint  Patient presents with  . Constipation    Michael Horton is a 74 y.o. male.  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  Was told to come the emergency department after radiation oncology treatment due to constipation.  Patient has not had a bowel movement in 5 days.  Has had some episodes of vomiting and some mild abdominal pain.  Patient has been taking MiraLAX at home.  As well as using Dulcolax as well.  Patient based on most recent admission is a DNR.        Past Medical History:  Diagnosis Date  . AAA (abdominal aortic aneurysm) ([place])   . Anxiety   . Cancer (HCC)    LUNG CANCER  . Depression   . GERD (gastroesophageal reflux disease)   . Hemoptysis 04/2020  . Hyperlipidemia     Patient Active Problem List   Diagnosis Date Noted  . Brain metastases ([place]) 05/04/2020  . Palliative care by specialist   . DNR (do not resuscitate)   . Weakness generalized   . Malignant neoplasm of hilus of left lung ([place])   . Hemoptysis 04/30/2020  . Prolapsed internal hemorrhoids, grade 4 04/18/2020  . Loss of weight 04/18/2020  . Renal mass, left 04/02/2020  . Aortic aneurysm, abdominal ([place]) 03/22/2020  . Abdominal pain 03/19/2020  . Nausea without vomiting 03/19/2020  . Microcytic anemia 03/19/2020  . Constipation 03/19/2020  . Rectal bleeding 03/15/2020  . Grade II hemorrhoids 03/15/2020  . Vitamin D deficiency 01/06/2017  . Hypertriglyceridemia 01/06/2017  . GERD (gastroesophageal reflux disease) 01/02/2017  . Smoking greater than 40 pack years 01/02/2017  . Generalized anxiety disorder 01/01/2014    Past Surgical History:  Procedure Laterality Date  . ANKLE FRACTURE SURGERY Right 2017  . BRONCHIAL NEEDLE ASPIRATION BIOPSY  05/01/2020   Procedure: BRONCHIAL  NEEDLE ASPIRATION BIOPSIES;  Surgeon: Candee Furbish, MD;  Location: [facility] ENDOSCOPY;  Service: Endoscopy;;  . COLONOSCOPY     Per patient, this was in [place], no polyps  . CRYOTHERAPY  05/01/2020   Procedure: CRYOTHERAPY;  Surgeon: Candee Furbish, MD;  Location: [facility];  Service: Endoscopy;;  . ENDOBRONCHIAL ULTRASOUND Left 05/01/2020   Procedure: ENDOBRONCHIAL ULTRASOUND;  Surgeon: Candee Furbish, MD;  Location: [facility] ENDOSCOPY;  Service: Endoscopy;  Laterality: Left;  . HERNIA REPAIR  1984  . SPINE SURGERY  1987   lumbar  . TIBIA FRACTURE SURGERY Right 2017  . VIDEO BRONCHOSCOPY Left 05/01/2020   Procedure: VIDEO BRONCHOSCOPY;  Surgeon: Candee Furbish, MD;  Location: [facility] ENDOSCOPY;  Service: Endoscopy;  Laterality: Left;       Family History  Problem Relation Age of Onset  . Dementia Mother   . Alzheimer's disease Mother   . Hypertension Mother   . Heart disease Father   . Glaucoma Father   . Lung cancer Paternal Uncle   . Throat cancer Paternal Grandfather   . Heart disease Brother   . Arthritis Brother        back issues   . Lymphoma Sister   . Colon cancer Neg Hx   . Esophageal cancer Neg Hx   . Stomach cancer Neg Hx   . Colon polyps Neg Hx     Social History   Tobacco Use  .  Smoking status: Former Smoker    Packs/day: 0.50    Years: 56.00    Pack years: 28.00    Types: Cigarettes    Quit date: 03/21/2020    Years since quitting: 0.1  . Smokeless tobacco: Never Used  . Tobacco comment: quit 03/21/2020  Vaping Use  . Vaping Use: Never used  Substance Use Topics  . Alcohol use: No    Comment: rare  . Drug use: No    Home Medications Prior to Admission medications   Medication Sig Start Date End Date Taking? Authorizing Provider  acetaminophen (TYLENOL) 650 MG CR tablet Take 650 mg by mouth every 8 (eight) hours as needed for pain.    [provider]  citalopram (CELEXA) 40 MG tablet Take 1 tablet (40 mg total) by mouth daily. Patient taking  differently: Take 20 mg by mouth daily.  01/13/20   Sharion Balloon, FNP  dexamethasone (DECADRON) 4 MG tablet Take 1 tablet (4 mg total) by mouth 2 (two) times daily with a meal. On 05/10/20 begin taking 4 mg three times per day for 7 days then, 4 mg two times per day for 7 days then, 2 mg (1/2 tablet) two times per day for 7 days then, 2 mg once a day for 7 days then, STOP. 05/09/20   Tyler Pita, MD  dicyclomine (BENTYL) 20 MG tablet Take 1 tablet (20 mg total) by mouth 4 (four) times daily as needed for spasms. Patient not taking: Reported on 05/09/2020 04/10/20   Evelina Dun A, FNP  diphenhydramine-acetaminophen (TYLENOL PM) 25-500 MG TABS tablet Take 1 tablet by mouth at bedtime as needed (sleep).    [provider]  docusate sodium (COLACE) 100 MG capsule Take 100 mg by mouth daily as needed for mild constipation.    [provider]  Ensure (ENSURE) Take 1 Can by mouth daily.    [provider]  ferrous sulfate 325 (65 FE) MG tablet Take 325 mg by mouth daily with breakfast.    [provider]  fluconazole (DIFLUCAN) 100 MG tablet Take 1 tablet (100 mg total) by mouth daily for 7 days. Take 200 mg today then 100 mg daily for 7 days. 05/09/20 05/16/20  Tyler Pita, MD  Misc Natural Products ([place]) Take 2 capsules by mouth daily.    [provider]  Multiple Vitamin (MULTI VITAMIN DAILY PO) Take 1 tablet by mouth daily.     [provider]  nicotine polacrilex (NICORETTE) 4 MG gum Take 4 mg by mouth as needed for smoking cessation.    [provider]  omeprazole (PRILOSEC) 20 MG capsule Take 1 capsule (20 mg total) by mouth 2 (two) times daily before a meal. Patient taking differently: Take 20 mg by mouth daily.  03/01/20   Sharion Balloon, FNP  ondansetron (ZOFRAN) 4 MG tablet Take 1 tablet (4 mg total) by mouth every 8 (eight) hours as needed for nausea or vomiting. 04/10/20   Sharion Balloon, FNP    PRESCRIPTION MEDICATION Place 1 application rectally daily as needed (hemorrhoids). Hydrocortisone 2.5 % Lidocaine 5% Phenylephrine 0.25% Pramoxine 1% rectal cream    [provider]  tamsulosin (FLOMAX) 0.4 MG CAPS capsule Take 1 capsule (0.4 mg total) by mouth daily after supper. 05/04/20 06/03/20  Antonieta Pert, MD  traMADol (ULTRAM) 50 MG tablet Take 1 tablet (50 mg total) by mouth every 6 (six) hours as needed for up to 5 days for moderate pain.  05/04/20 05/09/20  Antonieta Pert, MD    Allergies    Patient has no known allergies.  Review of Systems   Review of Systems  Constitutional: Negative for chills and fever.  HENT: Negative for congestion, rhinorrhea and sore throat.   Eyes: Negative for visual disturbance.  Respiratory: Negative for cough and shortness of breath.   Cardiovascular: Negative for chest pain and leg swelling.  Gastrointestinal: Positive for abdominal pain, constipation, nausea and vomiting. Negative for diarrhea.  Genitourinary: Negative for dysuria.  Musculoskeletal: Negative for back pain and neck pain.  Skin: Negative for rash.  Neurological: Negative for dizziness, light-headedness and headaches.  Hematological: Does not bruise/bleed easily.  Psychiatric/Behavioral: Negative for confusion.    Physical Exam Updated Vital Signs BP (!) 144/92 (BP Location: Left Arm)   Pulse (!) 58   Temp 97.9 F (36.6 C) (Oral)   Resp 16   Ht 1.778 m (5\' 10" )   Wt 68.9 kg   SpO2 96%   BMI 21.78 kg/m   Physical Exam Vitals and nursing note reviewed.  Constitutional:      Appearance: Normal appearance. He is well-developed.  HENT:     Head: Normocephalic and atraumatic.  Eyes:     Extraocular Movements: Extraocular movements intact.     Conjunctiva/sclera: Conjunctivae normal.     Pupils: Pupils are equal, round, and reactive to light.  Cardiovascular:     Rate and Rhythm: Normal rate and regular rhythm.     Heart sounds: No murmur heard.   Pulmonary:      Effort: Pulmonary effort is normal. No respiratory distress.     Breath sounds: Normal breath sounds.  Abdominal:     General: There is no distension.     Palpations: Abdomen is soft.     Tenderness: There is no abdominal tenderness. There is no guarding.     Comments: Abdomen soft nontender no distention.  Musculoskeletal:     Cervical back: Normal range of motion and neck supple.  Skin:    General: Skin is warm and dry.     Capillary Refill: Capillary refill takes less than 2 seconds.  Neurological:     Mental Status: He is alert and oriented to person, place, and time. Mental status is at baseline.     ED Results / Procedures / Treatments   Labs (all labs ordered are listed, but only abnormal results are displayed) Labs Reviewed  COMPREHENSIVE METABOLIC PANEL - Abnormal; Notable for the following components:      Result Value   Sodium 129 (*)    Potassium 5.3 (*)    Chloride 87 (*)    Glucose, Bld 122 (*)    BUN 25 (*)    Albumin 3.4 (*)    All other components within normal limits  CBC WITH DIFFERENTIAL/PLATELET - Abnormal; Notable for the following components:   WBC 15.9 (*)    Hemoglobin 10.6 (*)    HCT 33.9 (*)    MCV 77.2 (*)    MCH 24.1 (*)    RDW 19.4 (*)    Platelets 442 (*)    Neutro Abs 14.4 (*)    Lymphs Abs 0.3 (*)    Abs Immature Granulocytes 0.47 (*)    All other components within normal limits  URINALYSIS, ROUTINE W REFLEX MICROSCOPIC - Abnormal; Notable for the following components:   APPearance HAZY (*)    Leukocytes,Ua LARGE (*)    WBC, UA >50 (*)    Bacteria, UA RARE (*)  All other components within normal limits  URINE CULTURE    EKG None  Radiology CT Abdomen Pelvis W Contrast  Result Date: 05/09/2020 CLINICAL DATA:  74 year old male with history of lower abdominal pain and constipation for the past 5 days. Suspected bowel obstruction. EXAM: CT ABDOMEN AND PELVIS WITH CONTRAST TECHNIQUE: Multidetector CT imaging of the abdomen and  pelvis was performed using the standard protocol following bolus administration of intravenous contrast. CONTRAST:  140mL OMNIPAQUE IOHEXOL 300 MG/ML  SOLN COMPARISON:  CT the abdomen and pelvis 04/27/2020. FINDINGS: Lower chest: Complete old chronic atelectasis of the left lower lobe. Large hiatal hernia. Atherosclerotic calcifications in the descending thoracic aorta and right coronary artery. Hepatobiliary: No suspicious cystic or solid hepatic lesions. No intra or extrahepatic biliary ductal dilatation. Gallbladder is normal in appearance. Pancreas: No pancreatic mass. No pancreatic ductal dilatation. No pancreatic or peripancreatic fluid collections or inflammatory changes. Spleen: Unremarkable. Adrenals/Urinary Tract: Ill-defined hypovascular mass-like areas in the kidneys bilaterally are increasingly evident compared to the prior examinations. The largest of these is on the left measuring approximately 6.1 x 6.0 x 8.4 cm (axial image 33 of series 2 and coronal image 96 of series 5). On the right the lesion is in the upper pole (axial image 28 of series 2 and coronal image 98 of series 5) measuring 6.7 x 5.7 x 3.9 cm. Multiple tiny nonobstructive calculi are noted within the collecting system of the right kidney measuring 4 mm or less in size. No additional calculi are noted within the collecting system of the left kidney, along the course of either ureter, or within the lumen of the urinary bladder. There is extensive enhancement of the urothelium in the left renal pelvis and proximal third of the left ureter where there is mild hydroureter. No right hydroureteronephrosis. Urinary bladder is normal in appearance. Bilateral adrenal glands are normal in appearance. Stomach/Bowel: Intra-abdominal portion of the stomach is normal. No pathologic dilatation of small bowel or colon. Normal appendix. Vascular/Lymphatic: Fusiform aneurysmal dilatation of the infrarenal abdominal aorta is again noted measuring 6.5 x  6.7 cm. Large burden of mural thrombus and/or atheromatous plaque. There is also aneurysmal dilatation of the common iliac arteries bilaterally measuring 2.3 cm on the right and 1.6 cm on the left. No lymphadenopathy noted in the abdomen or pelvis. Reproductive: Prostate gland and seminal vesicles are unremarkable in appearance. Other: No significant volume of ascites.  No pneumoperitoneum. Musculoskeletal: Chronic appearing T12 compression fracture with 50% loss of anterior vertebral body height. There are no aggressive appearing lytic or blastic lesions noted in the visualized portions of the skeleton. IMPRESSION: 1. Very unusual appearance of the kidneys bilaterally. 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 However, the possibility of infiltrative neoplasm is not excluded (but not strongly favored given the bilaterality of the findings). Correlation with urinalysis and potential urologic consultation is recommended. 2. Large fusiform infrarenal abdominal aortic aneurysm measuring 6.5 x 6.7 cm, similar to prior examinations. Vascular surgery consultation recommended due to increased risk of rupture for AAA >5.5 cm. This recommendation follows ACR consensus guidelines: White Paper of the ACR Incidental Findings Committee II on Vascular Findings. J Am Coll Radiol 2013; 10:789-794. 3. Chronic atelectasis of the left lower lobe redemonstrated. 4. Large hiatal hernia. 5. Additional incidental findings, as above. Electronically Signed   By: Vinnie Langton M.D.   On: 05/09/2020 21:00    Procedures Procedures (including critical care time)  Medications Ordered in ED Medications  0.9 %  sodium chloride infusion ( Intravenous New Bag/Given 05/09/20 1953)  iohexol (OMNIPAQUE) 300 MG/ML solution 100 mL (100 mLs Intravenous Contrast Given 05/09/20 2025)  sodium chloride (PF) 0.9 % injection (  Given by Other 05/09/20 2039)    ED Course  I have  reviewed the triage vital signs and the nursing notes.  Pertinent labs & imaging results that were available during my care of the patient were reviewed by me and considered in my medical decision making (see chart for details).    MDM Rules/Calculators/A&P                          Work-up without any acute findings on CT scan of the abdomen.  Radiology raise some concerns may be for pyelonephritis based on inflammation.  But patient known to have metastatic disease to the kidney area.  Urinalysis had large leukocytosis but no bacteria and nitrite negative.  Urine sent for culture but will not be started on antibiotics.  T scan showed no evidence of any constipation or any significant amount of retained stool.  Patient stable for discharge home and follow-up with radiation oncology and hematology oncology.     Final Clinical Impression(s) / ED Diagnoses Final diagnoses:  Decreased stooling    Rx / DC Orders ED Discharge Orders    None       Fredia Sorrow, MD 05/09/20 2238

## 2020-05-09 NOTE — ED Notes (Signed)
PT urine specimen and UA placed on triage urine bin.

## 2020-05-09 NOTE — Telephone Encounter (Signed)
Pts spouse called with c/o not having a BM in 4 days and abdominal pain. Abdominal pain is something that the pt has daily but they are concerned with pt not having a BM. Pt has taken Miralax  q 6hrs and 1 fleet enema with no BM. Pt was discharged from [facility] last week and has 1 more Radiation tomorrow.

## 2020-05-09 NOTE — Telephone Encounter (Signed)
Spoke with pts spouse. Pt doesn't take the Bentyl anymore. Pt doesn't have a distended abdomen, no n/v, is able to pass gas. Pt is going to start Miralax q 1 hour on the hour/up to 6 doses. Pts spouse is aware that if pts symptoms worsen or if he doesn't pass any stool, pt needs to go to the ED.

## 2020-05-09 NOTE — Progress Notes (Signed)
Note from PUT encounter 05/09/20 at 1800. Received patient and wife in the clinic s/p final radiation treatment. Patient in wheelchair. Patient alert and oriented at this time. Vitals stable. Reports abdominal pain around umbilicus 8 on a scale of 0-10. Reports nausea and vomiting. Reports having coughing spells which are productive with pink tinged sputum. Denies pain associated with swallowing. Reports shortness of breath unchanged. Wife reports the patient has poor recall and intermittent confusion. Patient reports daily headaches have resolved. Endorses taking decadron 4 mg qid. Thrush noted. Denies dizziness. Reports tinnitus and visual floaters have been present for years and are unchanged. Denies diplopia. Verbalized intent to present to the ED this evening for further evaluation of abdominal pain and no BM x 4 days.

## 2020-05-09 NOTE — Progress Notes (Signed)
I followed up on Michael Horton appts. He was a no show to Dr. Worthy Flank appt yesterday.  I completed a scheduling message to call and schedule patient on 05/16/20 with Cassie.

## 2020-05-09 NOTE — ED Triage Notes (Signed)
Pt c/o constipation. Per wife his last BM was 5 days ago.Pt has Stage 4 Lung Cancer, mets to Brain and Liver. Pt has his last radiation treatment today and was told to come to ED after treatment due to the constipation. Per wife, she gave him Miralax q6 hrs, then every hour after talking with GI MD. Wife states that pt threw up Miralax at 1500. Wife states that she has also given pt an enema. Pt states that he is able to pass gas this afternoon. Pt is A&Ox4.

## 2020-05-09 NOTE — Discharge Instructions (Signed)
CT scan showed no evidence of any significant blockage or constipation.  Continue your follow-up with your regular doctors.  CT raise some concerns about possible kidney infection.  Urinalysis did have some white blood cells in it but not classic for urinary tract infection.  Culture sent.  If culture grows significant bacteria you will be contacted.

## 2020-05-09 NOTE — Telephone Encounter (Signed)
Per Dr. Johny Shears request this RN called in Decadron taper and Diflucan to preferred pharmacy, [facility], [place]. Spoke with Diane, Software engineer, at [place], [address]. Patient aware medication will be ready for pick up tonight.

## 2020-05-09 NOTE — Telephone Encounter (Signed)
Make sure not taking  Bentyl. This can cause constipation. In fact, don't take this again and just remove from his regular meds. I do see he has been taking this for cramping. He initially had more frequent stools. Constipation multifactorial in setting of narcotics, bentyl, etc.   Not sure how long he has been taking the Miralax every 6 hours. If he has significant abdominal pain, N/V, needs to go to ED. Would be worried about impaction, obstruction. If +flatus, no N/V, abdomen not distended, may take 1 capful of Miralax every hour up to 6 doses.

## 2020-05-09 NOTE — Telephone Encounter (Signed)
Lmom, waiting on a return call.  

## 2020-05-09 NOTE — Telephone Encounter (Signed)
Received voicemail message from patient's wife, Burman Nieves, requesting a return call. Phoned her back. She explains that despite using Miralax and a fleets enema her husband has not had a bowel movement in four days and continues to have abdominal pain. Advised [name] to reach out to her husband's GI doctor. Advised if his GI doctor has no availability then a trip to the emergency room could be warranted to rule out a bowel obstruction. [name] verbalized understanding of all reviewed and verbalized her intent to call the GI office.

## 2020-05-10 ENCOUNTER — Encounter: Payer: Self-pay | Admitting: Vascular Surgery

## 2020-05-10 ENCOUNTER — Encounter (HOSPITAL_COMMUNITY): Admission: RE | Payer: Self-pay | Source: Home / Self Care

## 2020-05-10 ENCOUNTER — Telehealth: Payer: Self-pay | Admitting: Radiation Oncology

## 2020-05-10 ENCOUNTER — Other Ambulatory Visit: Payer: Self-pay | Admitting: Family

## 2020-05-10 ENCOUNTER — Inpatient Hospital Stay (HOSPITAL_COMMUNITY): Admission: RE | Admit: 2020-05-10 | Payer: Medicare HMO | Source: Home / Self Care | Admitting: Vascular Surgery

## 2020-05-10 ENCOUNTER — Telehealth: Payer: Self-pay | Admitting: Physician Assistant

## 2020-05-10 ENCOUNTER — Ambulatory Visit: Payer: Medicare HMO

## 2020-05-10 ENCOUNTER — Other Ambulatory Visit: Payer: Self-pay | Admitting: *Deleted

## 2020-05-10 DIAGNOSIS — C3402 Malignant neoplasm of left main bronchus: Secondary | ICD-10-CM

## 2020-05-10 SURGERY — INSERTION, ENDOVASCULAR STENT GRAFT, AORTA, ABDOMINAL
Anesthesia: General

## 2020-05-10 NOTE — Progress Notes (Signed)
The proposed treatment discussed in cancer conference 05/10/20 is for discussion purpose only and is not a binding recommendation. The patient was not physically examined nor present for their treatment options.  Therefore, final treatment plans cannot be decided.  

## 2020-05-10 NOTE — Telephone Encounter (Signed)
Phoned patient's wife, Burman Nieves, to inquire about patient's status. [name] reports her husband seems in much better spirits today. She goes onto say he drank his morning decaf coffee and presently is eating a sandwich. [name] verbalizes her intentions to get her husband back into his normal routine now that they do not have to present for radiation therapy. Reviewed PET and follow up appointment date and times. [name] denies any additional needs at this time but verbalizes appreciation for the return call.

## 2020-05-10 NOTE — Telephone Encounter (Signed)
Scheduled appt per 7/14 sch msg - called pt - no answer. Left message with appt date and time.

## 2020-05-11 ENCOUNTER — Ambulatory Visit: Payer: Medicare HMO

## 2020-05-12 LAB — URINE CULTURE: Culture: >=100000 — AB

## 2020-05-14 ENCOUNTER — Telehealth: Payer: Self-pay | Admitting: *Deleted

## 2020-05-14 ENCOUNTER — Ambulatory Visit: Payer: Medicare HMO

## 2020-05-14 ENCOUNTER — Telehealth: Payer: Self-pay | Admitting: Radiation Oncology

## 2020-05-14 MED ORDER — BACLOFEN 10 MG PO TABS
10.0000 mg | ORAL_TABLET | Freq: Three times a day (TID) | ORAL | 0 refills | Status: AC
Start: 2020-05-14 — End: ?

## 2020-05-14 NOTE — Telephone Encounter (Signed)
I sent baclofen for the patient

## 2020-05-14 NOTE — Addendum Note (Signed)
Addended by: Caryl Pina on: 05/14/2020 04:01 PM   Modules accepted: Orders

## 2020-05-14 NOTE — Telephone Encounter (Signed)
Hospice nurse asking if Michael Horton will be attending. I lvm that he will not be attending and to contact PCP or referring MD.

## 2020-05-14 NOTE — Telephone Encounter (Signed)
TC from [facility] He was admitted to their service today Pt has bad hiccups could Baclofen 10 mg TID called to [facility]

## 2020-05-14 NOTE — Telephone Encounter (Signed)
I received a message on Mr. Michael Horton from [facility].  Patient would like to go with hospice and not received treatment.  I called and spoke with his wife Michael Horton. She states that Hospice nurse is coming out today to see them.  I listened as she explained.  I told her I would cancel his PET scan appt and appt with med onc. She was thankful for the call. I called central scheduling and they cancelled the appt.

## 2020-05-14 NOTE — Telephone Encounter (Signed)
Voicemail from patient's wife, Burman Nieves, awaited me upon my arrival today. Phoned her back promptly. She reports over the weekend her husband decided to stop all medications and treatments. She confirms he does not wish to have his PET scan scheduled for tomorrow or a consultation with Dr. Worthy Flank PA. She said, "he wants nature to take its course." She goes onto explain she has been unable to reach [place]. I offered to assist her with this matter. She questions what to expect with her husband promptly stopping his decadron instead of tapering off. Encouraged her to discuss with him reconsidering since abruptly stopping decadron can negatively effect his quality of life is better. Explained that he is at risk of seizure activity, headaches, dizziness, nausea and vomiting associated with stopping decadron abruptly. She verbalized understanding.   At 1150 this RN was able to reach [place] at [facility] explains there was a power outage at the office and they are working from home. She committed to reaching out to [place], intake RN, to call me. Phoned [name] to inform her of my progress. Burman Nieves explains she just hung up with Cassandra following an intake interview. [name] denies additional needs at this time. Explained this RN would share her findings with all the providers. [name] verbalized appreciation.   Then, Cassandra phoned this RN at 1200 requesting Dr. Tammi Klippel be the patient's primary hospice physician. Explained this responsibility typical falls to the medical oncology team. Provided Cassandra with Dr. Worthy Flank nurse's number as well as Dr. Tomie China. She verbalized understanding and appreciation for the assistance.

## 2020-05-15 ENCOUNTER — Encounter (HOSPITAL_COMMUNITY): Payer: Self-pay

## 2020-05-15 ENCOUNTER — Ambulatory Visit: Payer: Medicare HMO

## 2020-05-15 ENCOUNTER — Ambulatory Visit (HOSPITAL_COMMUNITY): Payer: Medicare HMO

## 2020-05-16 ENCOUNTER — Ambulatory Visit: Payer: Medicare HMO | Admitting: Physician Assistant

## 2020-05-16 ENCOUNTER — Other Ambulatory Visit: Payer: Medicare HMO

## 2020-05-16 ENCOUNTER — Ambulatory Visit: Payer: Medicare HMO

## 2020-05-17 ENCOUNTER — Ambulatory Visit: Payer: Medicare HMO | Admitting: General Surgery

## 2020-05-25 ENCOUNTER — Telehealth: Payer: Self-pay | Admitting: *Deleted

## 2020-05-25 NOTE — Telephone Encounter (Signed)
VM from [place] w/ Hospice FYI: pt became weak and fell when getting up, fell on left side, no pain or bruising

## 2020-05-28 DIAGNOSIS — R52 Pain, unspecified: Secondary | ICD-10-CM | POA: Diagnosis not present

## 2020-05-28 DIAGNOSIS — I10 Essential (primary) hypertension: Secondary | ICD-10-CM | POA: Diagnosis not present

## 2020-05-28 DIAGNOSIS — Z7401 Bed confinement status: Secondary | ICD-10-CM | POA: Diagnosis not present

## 2020-05-30 ENCOUNTER — Telehealth: Payer: Self-pay | Admitting: Family

## 2020-05-30 NOTE — Telephone Encounter (Signed)
Will clarify standing orders with Dr. Warrick Parisian and fax form back to [facility]

## 2020-06-08 ENCOUNTER — Encounter (HOSPITAL_COMMUNITY): Payer: Self-pay

## 2020-06-18 ENCOUNTER — Telehealth: Payer: Self-pay

## 2020-06-18 ENCOUNTER — Telehealth: Payer: Self-pay | Admitting: Radiation Oncology

## 2020-06-18 NOTE — Telephone Encounter (Signed)
Opened in error

## 2020-06-18 NOTE — Telephone Encounter (Signed)
Called patient in regards to telephone appointment with Freeman Caldron PA on 06/20/20. Spoke with wife and was informed that patient passed away on 06/19/2020 on Hospice. Advised wife that I will make Ashlyn aware and I will reach out to Dr. Lew Dawes nurse to advise. TM

## 2020-06-20 ENCOUNTER — Ambulatory Visit: Admission: RE | Admit: 2020-06-20 | Source: Ambulatory Visit | Admitting: Urology

## 2020-06-26 ENCOUNTER — Ambulatory Visit: Payer: Medicare HMO | Admitting: Urology

## 2020-06-27 DEATH — deceased

## 2020-11-04 NOTE — Progress Notes (Signed)
  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     Indication for treatment:  Palliation       Radiation treatment dates:   7/8-7/14/21  Site/dose:   1. The whole brain was treated to 20 Gy in 5 fractions of 4 Gy 2. The left lung was treated to 20 Gy in 5 fractions of 4 Gy  Beams/energy:    1.  Right and Left radiation fields were treated using 6 MV X-rays with custom MLC collimation to shield the eyes and face.  The patient was immobilized with a thermoplastic mask and isocenter was verified with weekly port films. 2. Lung treated with two static gantry conformal fields at zero and 180 degrees.   Narrative: The patient tolerated radiation treatment relatively well.    Plan: The patient has completed radiation treatment. The patient will return to radiation oncology clinic for routine followup in one month. I advised them to call or return sooner if they have any questions or concerns related to their recovery or treatment. ________________________________  Sheral Apley. Tammi Klippel, M.D.

## 2021-03-30 IMAGING — CT CT CHEST W/ CM
2 of 13 series · 10 of 46 positions shown, 15 images · IV contrast (Omnipaque or Isovue)
Comparison: CT abdomen pelvis, 03/21/2020

CLINICAL DATA: Follow-up pyelonephritis

EXAM:
CT CHEST WITH CONTRAST
CT ABDOMEN AND PELVIS WITHOUT AND WITH CONTRAST
TECHNIQUE: Multidetector CT imaging of the chest was performed following the
standard protocol following the bolus administration of intravenous
contrast.

[Series 12: axial nephro · axial · 0.84mm/px · z∈[+985,+1519]mm · 8 of 230 slices shown, 13 images]
[im 26/230  soft-tissue]
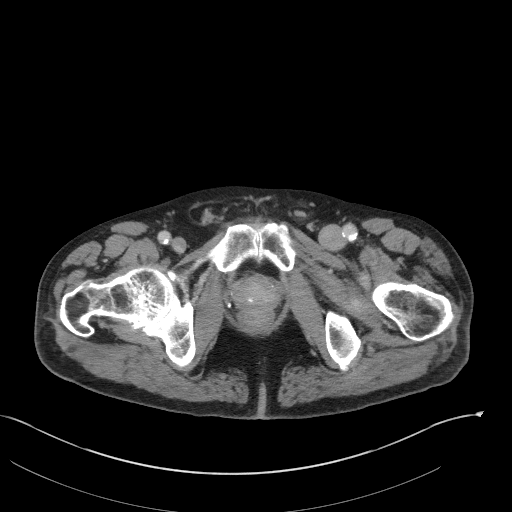
[im 26/230  bone]
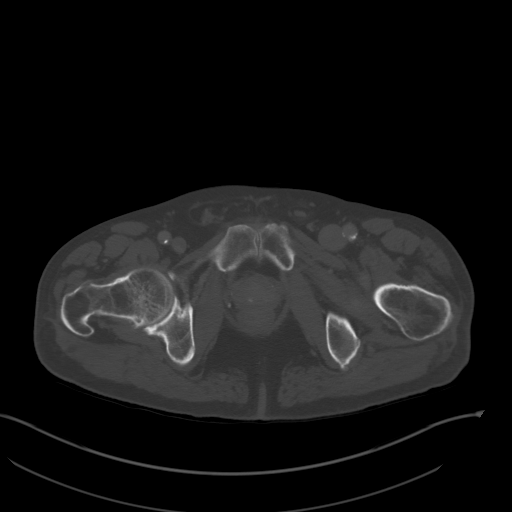
[im 51/230  soft-tissue]
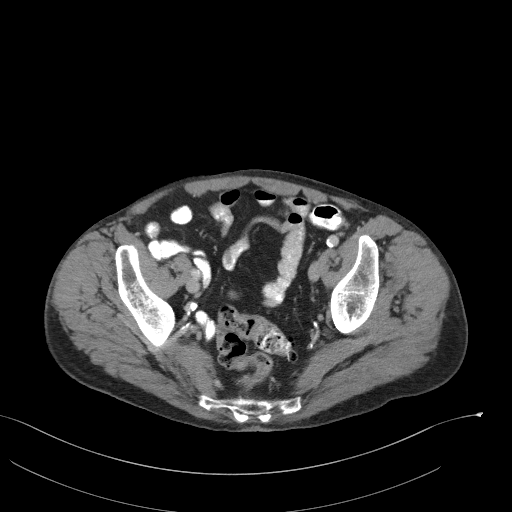
[im 77/230  soft-tissue]
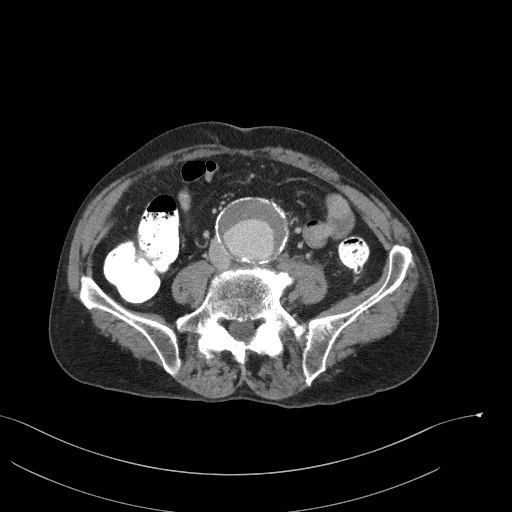
[im 102/230  soft-tissue]
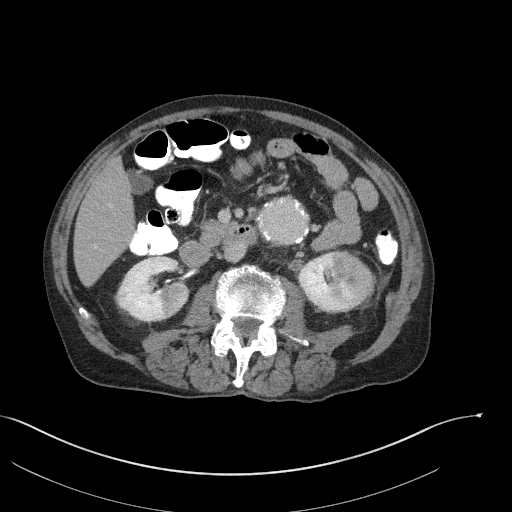
[im 128/230  soft-tissue]
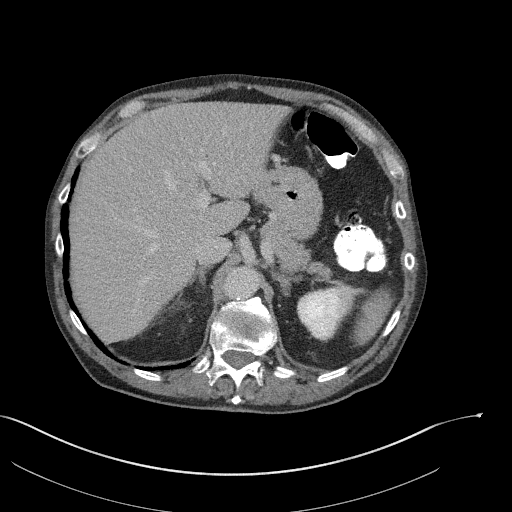
[im 128/230  lung]
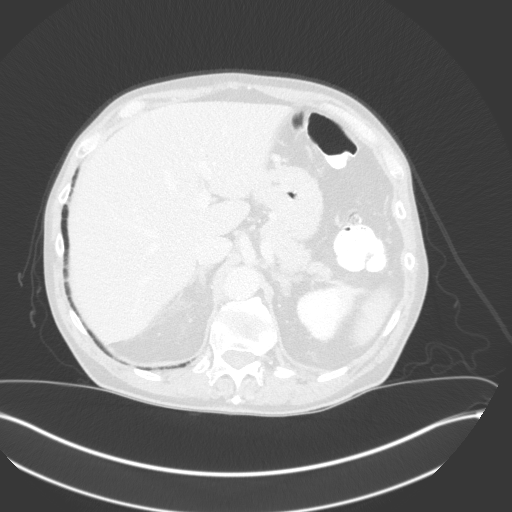
[im 153/230  soft-tissue]
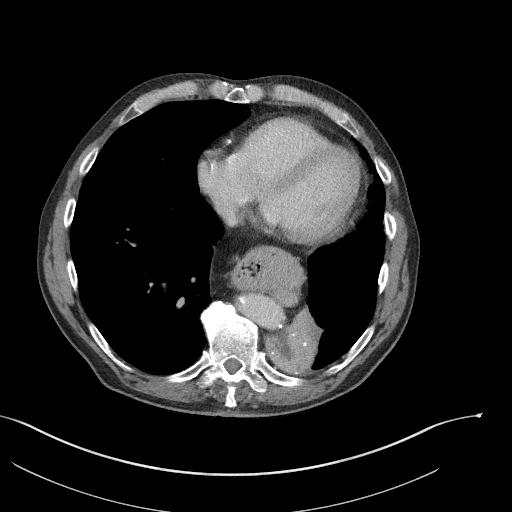
[im 153/230  lung]
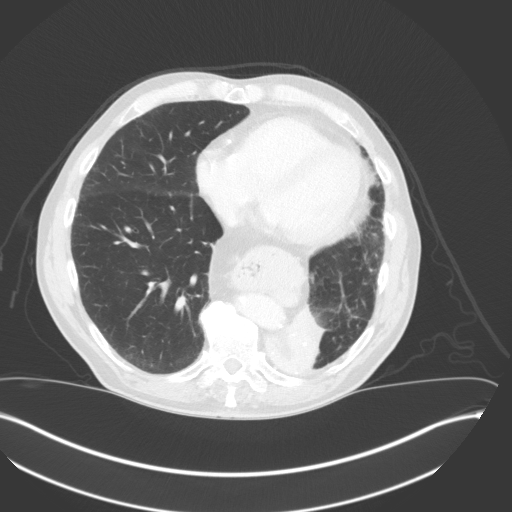
[im 179/230  soft-tissue]
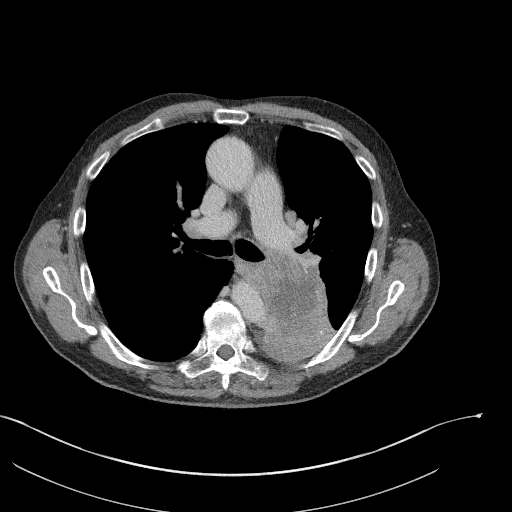
[im 179/230  lung]
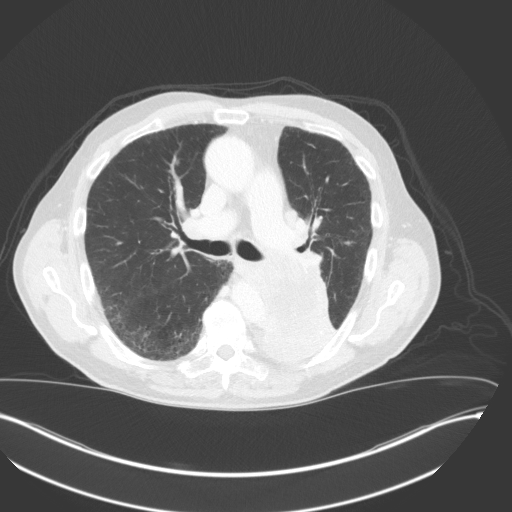
[im 204/230  soft-tissue]
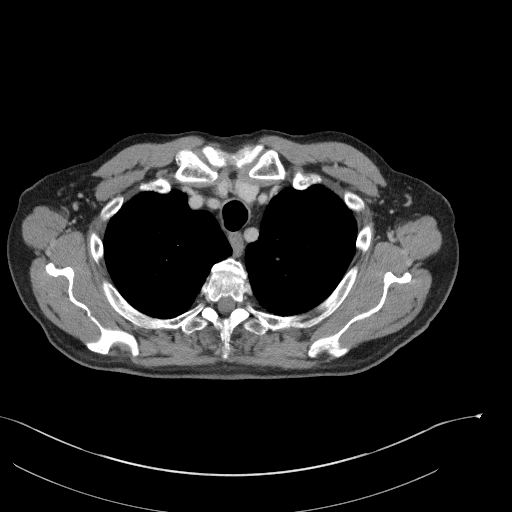
[im 204/230  lung]
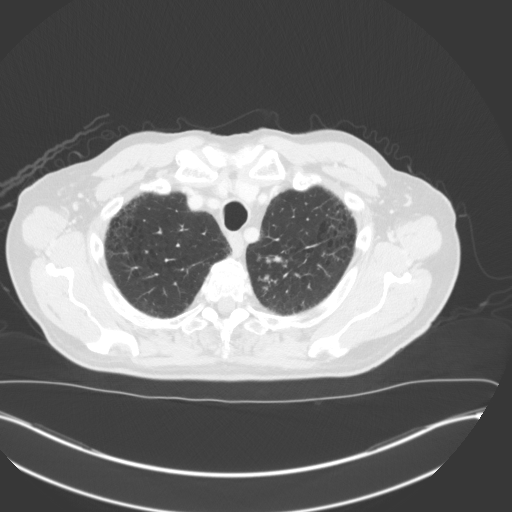

[Series 15: coronal nephro · coronal · 0.90mm/px · 2 of 114 slices shown]
[im 38/114  soft-tissue]
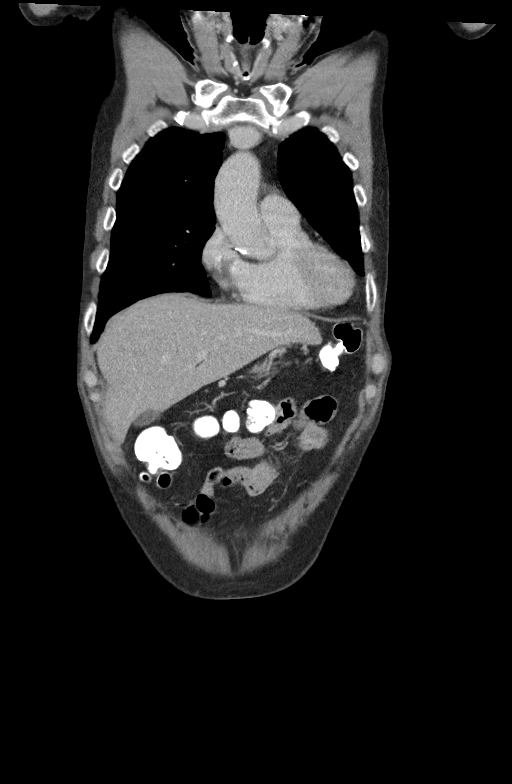
[im 76/114  soft-tissue]
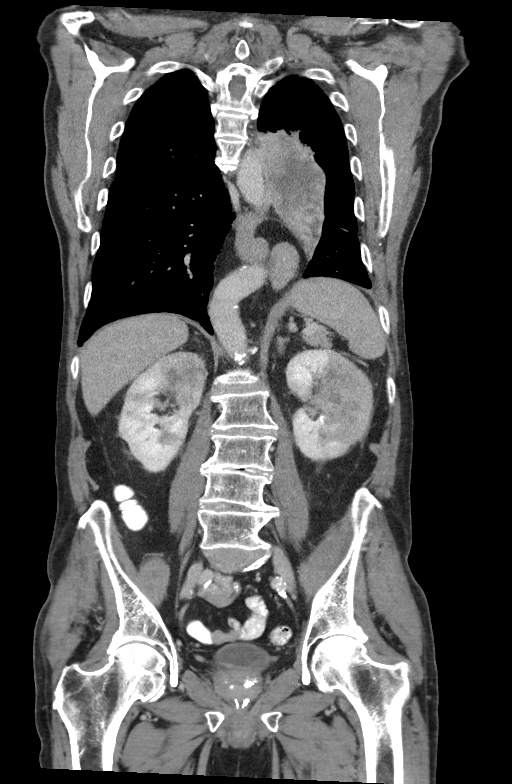

[10 of 46 positions shown; findings below may reference images not displayed]

Multidetector CT imaging of the abdomen and pelvis was performed
following the standard protocol before and following the bolus
administration of intravenous contrast. Additional oral enteric
contrast was administered.

CONTRAST:  100mL OMNIPAQUE IOHEXOL 300 MG/ML SOLN, additional oral
enteric contrast
FINDINGS: CT CHEST

Cardiovascular: Aortic atherosclerosis. Three-vessel coronary artery
calcifications. Normal heart size. No pericardial effusion.

Mediastinum/Nodes: Enlarged subcarinal and bilateral hilar lymph
nodes, measuring up to 2.9 x 1.6 cm (series 12, image 56). Moderate
hiatal hernia with intrathoracic position of the gastric fundus.
Thyroid gland, trachea, and esophagus demonstrate no significant
findings.

Lungs/Pleura: Mild centrilobular emphysema. There is a large,
heterogeneously hyperdense left hilar mass, measuring approximately
8.2 x 6.0 x 7.7 cm. There is obstruction of the left lower lobe
bronchus and total atelectasis of the left lower lobe. Trace left
pleural effusion. Clustered postobstructive nodularity in the left
pulmonary apex (series 14, image 27).

Musculoskeletal: No chest wall mass or suspicious bone lesions
identified.

CT ABDOMEN AND PELVIS

Hepatobiliary: No solid liver abnormality is seen. No gallstones,
gallbladder wall thickening, or biliary dilatation.

Pancreas: Unremarkable. No pancreatic ductal dilatation or
surrounding inflammatory changes.

Spleen: Normal in size without significant abnormality.

Adrenals/Urinary Tract: Adrenal glands are unremarkable. There has
been a significant increase in infiltrative appearing, hypodense
lesion of the left kidney, which now occupies most of the kidney and
measures approximately 8.9 x 5.7 x 5.3 cm (series 10, image 66).
There is an additional lesion of the superior pole of the right
kidney, which is now much more clearly appreciated than subtle
appearance on prior examination, measuring 5.3 x 4.9 x 4.8 cm
(series 10, image 70). Bladder is unremarkable.

Stomach/Bowel: Stomach is within normal limits. Appendix appears
normal. No evidence of bowel wall thickening, distention, or
inflammatory changes. Sigmoid diverticula.

Vascular/Lymphatic: Aortic atherosclerosis. Redemonstrated large
saccular aneurysm of the infrarenal abdominal aorta, measuring 6.8 x
6.6 cm, previously 6.7 x 6.5 cm (series 12, image 140). Additional
aneurysm of the bilateral common iliac arteries. No enlarged
abdominal or pelvic lymph nodes.

Reproductive: No mass or other significant abnormality.

Other: No abdominal wall hernia or abnormality. No abdominopelvic
ascites.

Musculoskeletal: No acute or significant osseous findings.
IMPRESSION: 1. There is a large, heterogeneously hyperdense left hilar mass,
measuring approximately 8.2 cm. There is obstruction of the left
lower lobe bronchus and total atelectasis of the left lower lobe.
2. Enlarged subcarinal and bilateral hilar lymph nodes, suspicious
for nodal metastatic disease.
3. There has been a significant increase in infiltrative appearing,
hypodense lesion of the left kidney, which now occupies most of the
kidney and measures approximately 8.9 cm. There is an additional
lesion of the superior pole of the right kidney, which is now much
more clearly appreciated than subtle appearance on prior
examination, measuring 5.3 cm.
4. Above constellation of findings is most consistent with primary
lung malignancy and an unusual pattern of metastatic disease
involving the renal parenchyma. No other evidence of distant
metastatic disease is appreciated.
5. Redemonstrated large saccular aneurysm of the infrarenal
abdominal aorta, measuring 6.8 x 6.6 cm, previously 6.7 x 6.5 cm.
Recommend surgical consultation. Aortic aneurysm NOS (LHR57-7A3.J).
Aortic Atherosclerosis ([id number]-[id number]).
6. Emphysema (LHR57-26K.5).
7. Coronary artery disease.

These results will be called to the ordering clinician or
representative by the Radiologist Assistant, and communication
documented in the PACS or [REDACTED].
